# Patient Record
Sex: Female | Born: 1975 | Race: White | Hispanic: No | Marital: Married | Smoking: Never smoker
Health system: Southern US, Community
[De-identification: ages and names within clinical notes are randomized; demographics above are authoritative.]

## PROBLEM LIST (undated history)

## (undated) DIAGNOSIS — F32A Depression, unspecified: Secondary | ICD-10-CM

## (undated) DIAGNOSIS — F419 Anxiety disorder, unspecified: Secondary | ICD-10-CM

## (undated) DIAGNOSIS — G8929 Other chronic pain: Secondary | ICD-10-CM

## (undated) HISTORY — DX: Anxiety disorder, unspecified: F41.9

## (undated) HISTORY — PX: BREAST EXCISIONAL BIOPSY: SUR124

## (undated) HISTORY — DX: Other chronic pain: G89.29

## (undated) HISTORY — DX: Depression, unspecified: F32.A

## (undated) HISTORY — PX: WISDOM TOOTH EXTRACTION: SHX21

---

## 2001-10-22 ENCOUNTER — Other Ambulatory Visit: Admission: RE | Admit: 2001-10-22 | Discharge: 2001-10-22 | Payer: Self-pay | Admitting: Obstetrics and Gynecology

## 2002-09-25 ENCOUNTER — Encounter: Admission: RE | Admit: 2002-09-25 | Discharge: 2002-09-25 | Payer: Self-pay | Admitting: Obstetrics and Gynecology

## 2002-11-11 ENCOUNTER — Other Ambulatory Visit: Admission: RE | Admit: 2002-11-11 | Discharge: 2002-11-11 | Payer: Self-pay | Admitting: Obstetrics and Gynecology

## 2003-11-17 ENCOUNTER — Other Ambulatory Visit: Admission: RE | Admit: 2003-11-17 | Discharge: 2003-11-17 | Payer: Self-pay | Admitting: Obstetrics and Gynecology

## 2003-11-26 ENCOUNTER — Encounter: Admission: RE | Admit: 2003-11-26 | Discharge: 2003-11-26 | Payer: Self-pay | Admitting: Obstetrics and Gynecology

## 2004-01-12 ENCOUNTER — Encounter: Admission: RE | Admit: 2004-01-12 | Discharge: 2004-01-12 | Payer: Self-pay | Admitting: Obstetrics & Gynecology

## 2004-01-12 ENCOUNTER — Encounter (INDEPENDENT_AMBULATORY_CARE_PROVIDER_SITE_OTHER): Payer: Self-pay | Admitting: *Deleted

## 2005-07-31 ENCOUNTER — Encounter: Admission: RE | Admit: 2005-07-31 | Discharge: 2005-07-31 | Payer: Self-pay | Admitting: Obstetrics and Gynecology

## 2007-10-19 ENCOUNTER — Inpatient Hospital Stay (HOSPITAL_COMMUNITY): Admission: AD | Admit: 2007-10-19 | Discharge: 2007-10-22 | Payer: Self-pay | Admitting: Obstetrics and Gynecology

## 2008-03-23 ENCOUNTER — Encounter: Admission: RE | Admit: 2008-03-23 | Discharge: 2008-03-23 | Payer: Self-pay | Admitting: Family Medicine

## 2008-03-24 ENCOUNTER — Ambulatory Visit (HOSPITAL_COMMUNITY): Admission: RE | Admit: 2008-03-24 | Discharge: 2008-03-24 | Payer: Self-pay | Admitting: Family Medicine

## 2008-08-31 ENCOUNTER — Inpatient Hospital Stay (HOSPITAL_COMMUNITY): Admission: AD | Admit: 2008-08-31 | Discharge: 2008-08-31 | Payer: Self-pay | Admitting: Obstetrics & Gynecology

## 2008-12-18 ENCOUNTER — Inpatient Hospital Stay (HOSPITAL_COMMUNITY): Admission: AD | Admit: 2008-12-18 | Discharge: 2008-12-20 | Payer: Self-pay | Admitting: Obstetrics and Gynecology

## 2009-03-31 ENCOUNTER — Encounter: Admission: RE | Admit: 2009-03-31 | Discharge: 2009-03-31 | Payer: Self-pay | Admitting: Obstetrics and Gynecology

## 2009-09-17 IMAGING — CT CT HEAD W/O CM
1 series · 15 of 28 positions shown, 19 images · non-contrast
Comparison: None

Addendum Begins

Critical test results telephoned to Dr. Minoguchi at the time of
interpretation on date 03/23/2008 at time 1258 hours. These results
were repeated back to the interpreting radiologist for
verification.
Addendum Ends
CLINICAL DATA: Frequent headaches, recent near syncopal episodes,
some blurred vision in the right eye linear opacity adjacent to the
right sella suspicious for possible aneurysm.  Recommend MRI of the
brain with MRA.  No acute intracranial abnormality.
CT HEAD WITHOUT CONTRAST
TECHNIQUE: Contiguous axial images were obtained from the base of
the skull through the vertex without contrast.

[Series 2: head · axial · 0.43mm/px · z∈[+17,+142]mm · 15 of 28 slices shown, 19 images]
[im 2/28  brain]
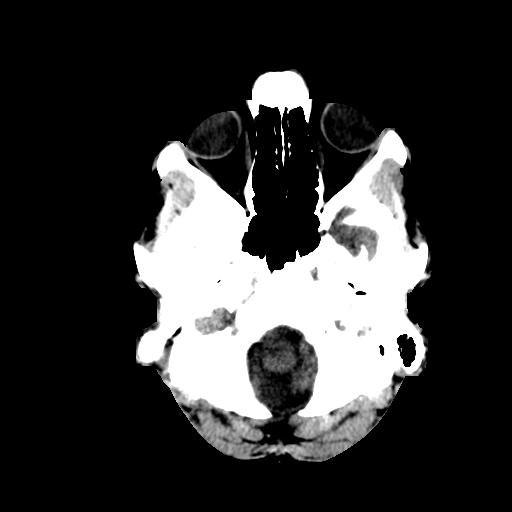
[im 2/28  bone]
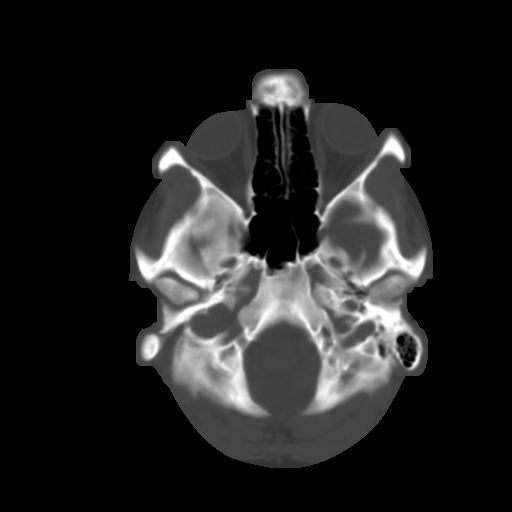
[im 4/28  brain]
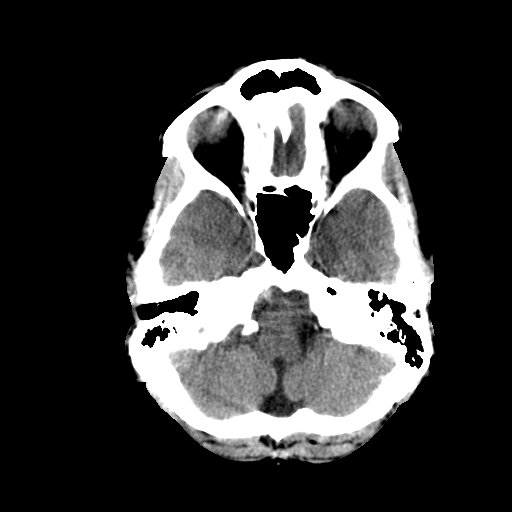
[im 6/28  brain]
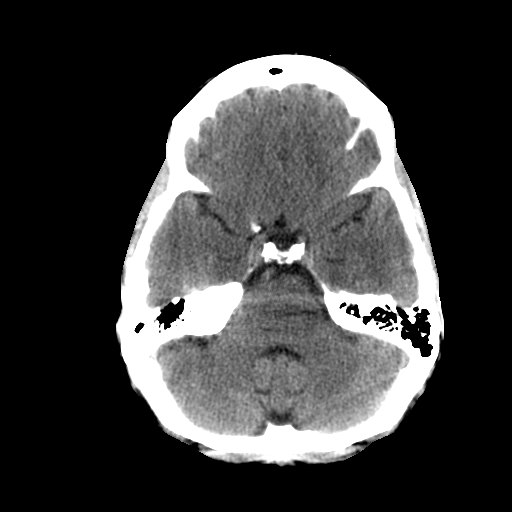
[im 8/28  brain]
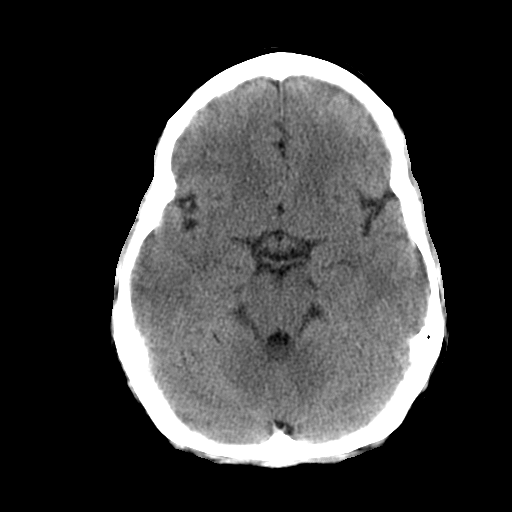
[im 9/28  brain]
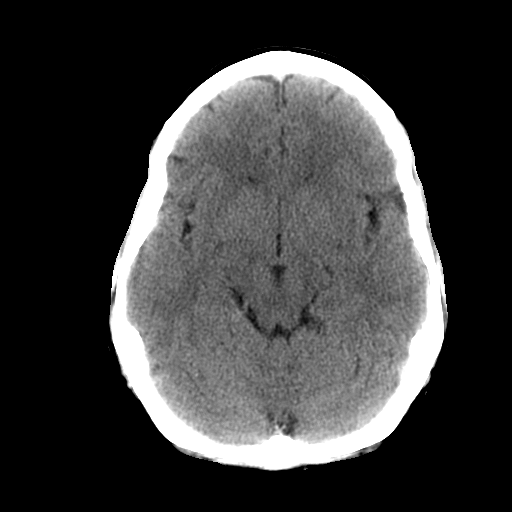
[im 9/28  bone]
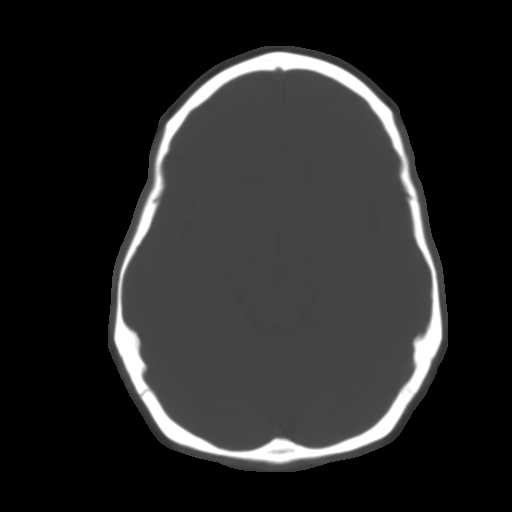
[im 11/28  brain]
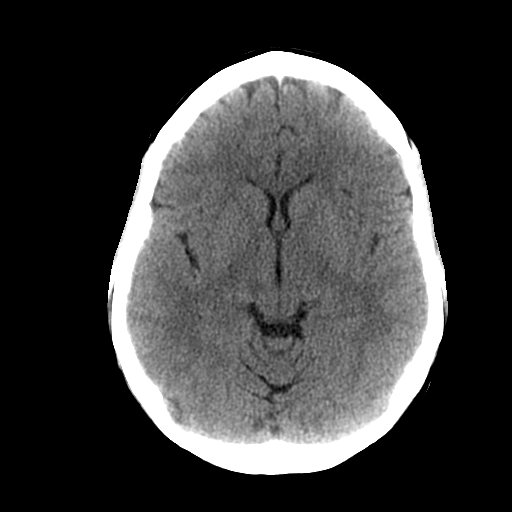
[im 13/28  brain]
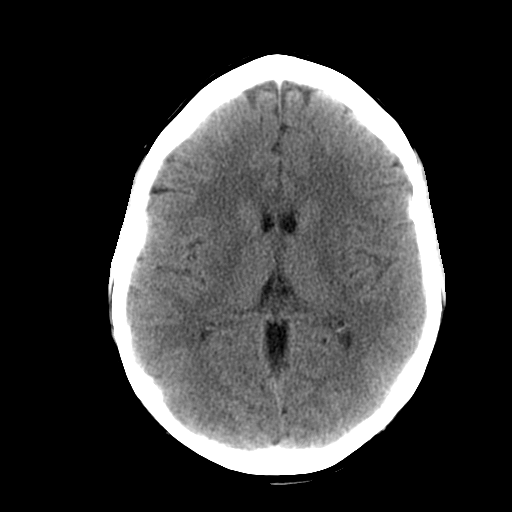
[im 15/28  brain]
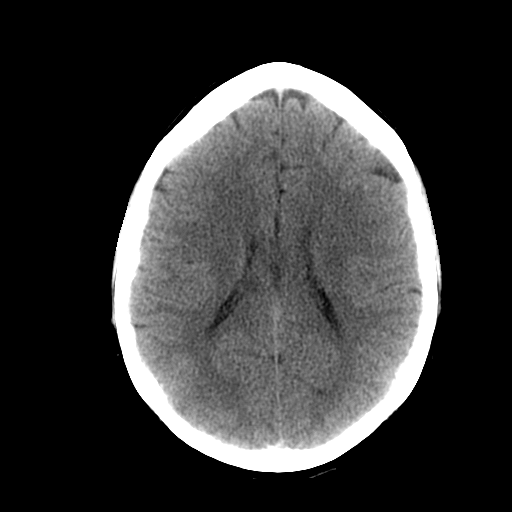
[im 16/28  brain]
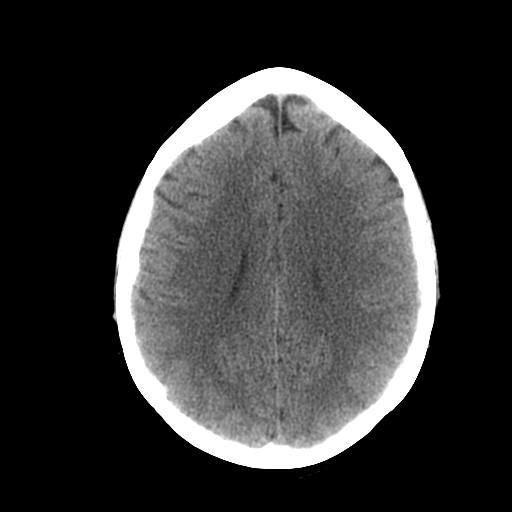
[im 16/28  bone]
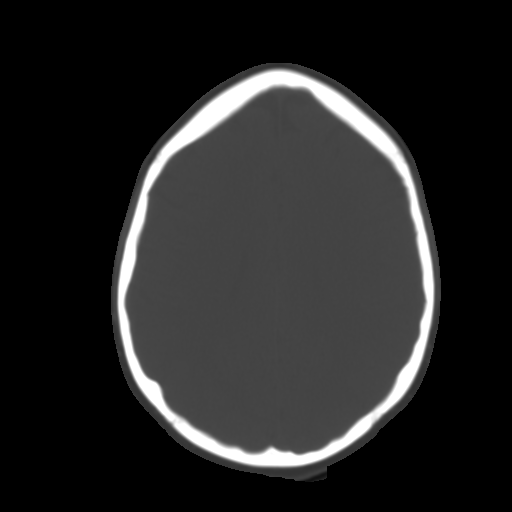
[im 18/28  brain]
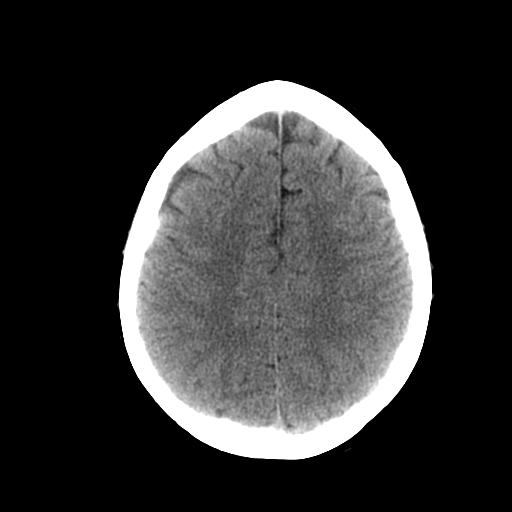
[im 20/28  brain]
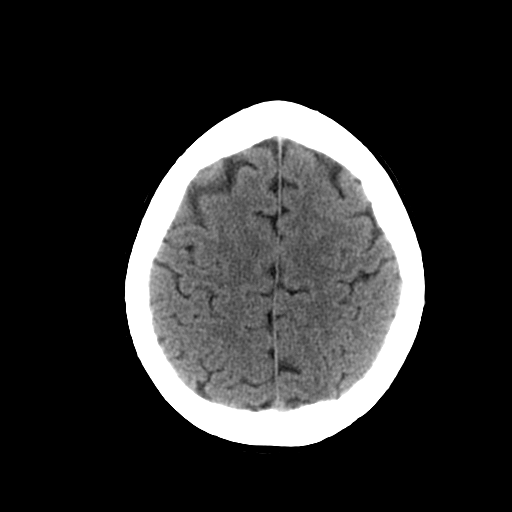
[im 21/28  brain]
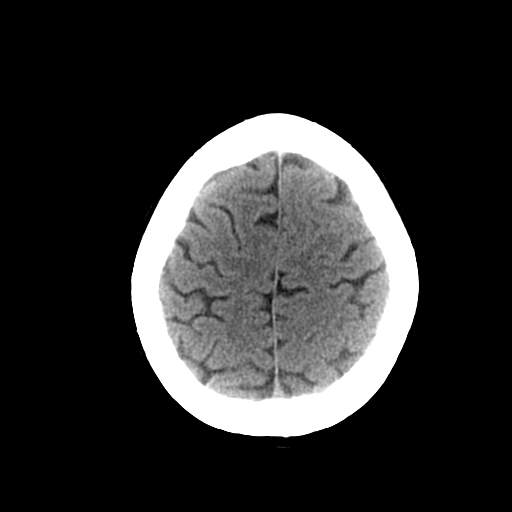
[im 23/28  brain]
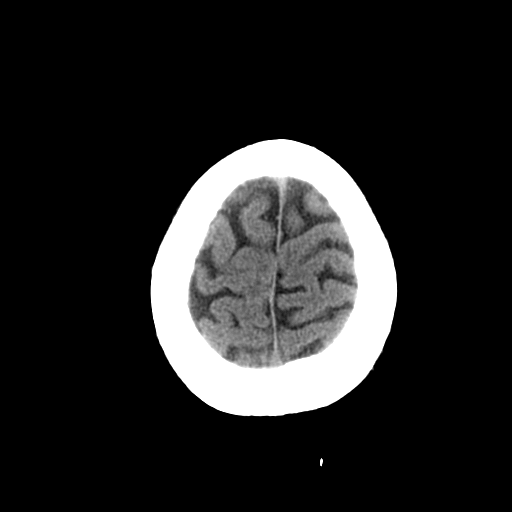
[im 23/28  bone]
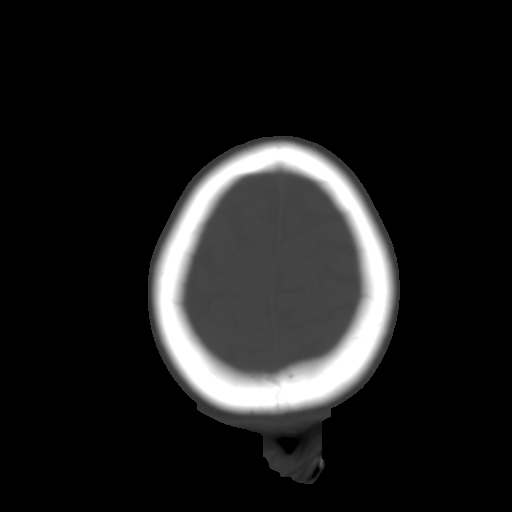
[im 25/28  brain]
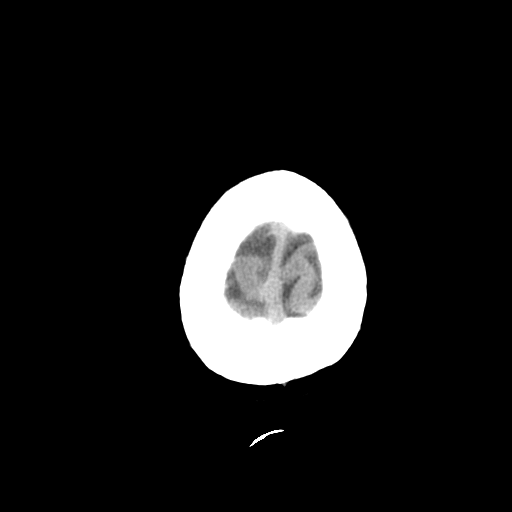
[im 27/28  brain]
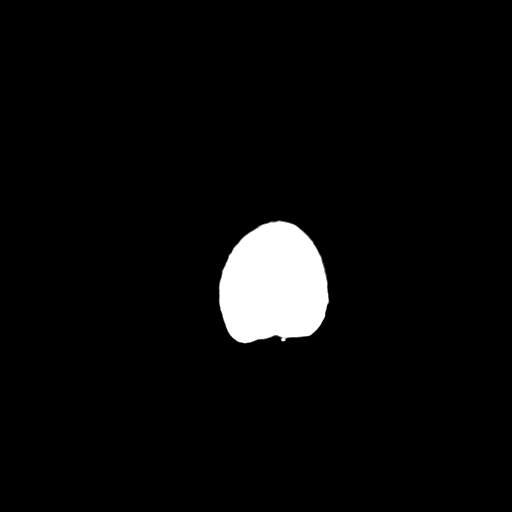

[15 of 28 positions shown; findings below may reference images not displayed]

FINDINGS: The ventricular system is normal in size and
configuration, and the septum is in a normal midline position.  The
fourth ventricle and basilar cisterns appear normal.  No blood,
edema, or mass effect is seen.  There is an area of higher
attenuation right parasellar in location.  This could be vascular
i.e. represent aneurysm involving possibly the right internal
carotid artery or right posterior communicating artery.  MRI of the
brain is recommend with MRA. No bony abnormality is seen..
IMPRESSION: Linear higher attenuation in the right parasellar location
worrisome for aneurysm as described above.  Recommend MRI of the
brain with MRA to assess further.

## 2009-12-30 ENCOUNTER — Encounter: Admission: RE | Admit: 2009-12-30 | Discharge: 2009-12-30 | Payer: Self-pay | Admitting: Internal Medicine

## 2011-01-18 ENCOUNTER — Other Ambulatory Visit: Payer: Self-pay | Admitting: Internal Medicine

## 2011-01-18 DIAGNOSIS — D497 Neoplasm of unspecified behavior of endocrine glands and other parts of nervous system: Secondary | ICD-10-CM

## 2011-01-22 ENCOUNTER — Other Ambulatory Visit: Payer: Self-pay

## 2011-01-26 ENCOUNTER — Ambulatory Visit
Admission: RE | Admit: 2011-01-26 | Discharge: 2011-01-26 | Disposition: A | Discharge status: Home / Self Care | Payer: BC Managed Care – PPO | Source: Ambulatory Visit | Attending: Internal Medicine | Admitting: Internal Medicine

## 2011-01-26 DIAGNOSIS — D497 Neoplasm of unspecified behavior of endocrine glands and other parts of nervous system: Secondary | ICD-10-CM

## 2011-01-26 MED ORDER — GADOBENATE DIMEGLUMINE 529 MG/ML IV SOLN: 10.0000 mL | Freq: Once | INTRAVENOUS | Status: AC | PRN

## 2011-04-02 LAB — CBC
HCT: 33.3 % — ABNORMAL LOW (ref 36.0–46.0)
HCT: 37.2 % (ref 36.0–46.0)
Hemoglobin: 11.3 g/dL — ABNORMAL LOW (ref 12.0–15.0)
Hemoglobin: 12.4 g/dL (ref 12.0–15.0)
MCHC: 33.4 g/dL (ref 30.0–36.0)
MCHC: 33.8 g/dL (ref 30.0–36.0)
MCV: 92.8 fL (ref 78.0–100.0)
MCV: 93.5 fL (ref 78.0–100.0)
Platelets: 156 10*3/uL (ref 150–400)
Platelets: 176 10*3/uL (ref 150–400)
RBC: 3.57 MIL/uL — ABNORMAL LOW (ref 3.87–5.11)
RBC: 4.01 MIL/uL (ref 3.87–5.11)
RDW: 14.2 % (ref 11.5–15.5)
RDW: 14.3 % (ref 11.5–15.5)
WBC: 7.8 10*3/uL (ref 4.0–10.5)
WBC: 9.5 10*3/uL (ref 4.0–10.5)

## 2011-04-02 LAB — RPR: RPR Ser Ql: NONREACTIVE

## 2011-05-01 NOTE — H&P (Signed)
NAME:  Carolyn Rodriguez, Carolyn Rodriguez NO.:  [id number]   MEDICAL RECORD NO.:  [id number]          PATIENT TYPE:  INP   LOCATION:                                FACILITY:  [facility]   PHYSICIAN:  Lenoard Aden, M.D.     DATE OF BIRTH:   DATE OF ADMISSION:  DATE OF DISCHARGE:                              HISTORY & PHYSICAL   CHIEF COMPLAINT:  Postdate oligohydramnios.   A 35 year old white female G1, P0 at 45 weeks' gestation who presents  for cervical ripening and induction, history of AFI of 4 with stable  reassuring surveillance for followup induction as noted.   SHE HAS ALLERGIES TO CODEINE.   MEDICATIONS:  Prenatal vitamins.   She has a family history of breast cancer, leukemia, throat cancer, lung  cancer, heart disease, chronic hypertension, diabetes, and depression.  She has a personal history of a local excision of a benign breast cyst,  history of HSV x1, history of wisdom tooth extraction, history of  lumpectomy as noted for benign polyp.  She was a previous smoker,  stopped with a positive pregnancy test.   PRENATAL COURSE:  Uncomplicated to date.  Of note, group B strep, HIV  was negative and group B strep performed was negative.   PHYSICAL EXAM:  She is a weight of 165 pounds.  Height of 5 feet.  Blood  pressure 100/60.  HEENT:  Normal.  LUNGS:  Clear.  NECK:  Supple.  Full range of motion.  LUNGS:  Clear to auscultation.  HEART:  Regular rhythm.  ABDOMEN:  Soft, gravid, and nontender.  Estimated fetal weight on  ultrasound of 8-1/2 pounds.  Cervix is closed, 70% vertex, 0 station.  EXTREMITIES:  Normal.  NEUROLOGIC:  Nonfocal.  SKIN:  Intact.  ___________ is reactive.  Cervidil was placed.   IMPRESSION:  1. A 41-week intrauterine pregnancy.  2. History of oligohydramnios.  3. Unfavorable cervix.   PLAN:  Proceed with induction.  Risks and benefits discussed.  Epidural  as needed.  Anticipate attempts at vaginal delivery.      Lenoard Aden,  M.D.  Electronically Signed     RJT/MEDQ  D:  10/19/2007  T:  10/20/2007  Job:  [id number]   cc:   Lenoard Aden, M.D.  Fax: [phone]

## 2011-05-01 NOTE — H&P (Signed)
NAME:  CAMARIE, MCTIGUE         ACCOUNT NO.:  [id number]   MEDICAL RECORD NO.:  [id number]          PATIENT TYPE:  INP   LOCATION:  9106                          FACILITY:  [facility]   PHYSICIAN:  Lenoard Aden, M.D.DATE OF BIRTH:  1976-11-24   DATE OF ADMISSION:  12/18/2008  DATE OF DISCHARGE:                              HISTORY & PHYSICAL   CHIEF COMPLAINT:  Postdates induction.   She is a 32-year white female G2, P1, at 51 weeks' gestation, who  presents for induction.   ALLERGIES:  She has no known drug allergies.   MEDICATIONS:  Prenatal vitamins.   SOCIAL HISTORY:  She is a nonsmoker and nondrinker.  She denies domestic  or physical violence.   FAMILY HISTORY:  She has a family history of breast cancer, throat  cancer, lung cancer, heart disease, depression, hypertension, and  diabetes.   PRENATAL COURSE:  Otherwise, uncomplicated first pregnancy, postdates  with an 8 pounds vaginal delivery.   PHYSICAL EXAMINATION:  GENERAL:  She is a well-developed and well-  nourished, white female in no acute distress.  HEENT: Normal.  LUNGS: Clear.  HEART:  Regular rhythm.  ABDOMEN:  Soft, gravid, and nontender.  PELVIC:  Estimated fetal weight 8-10 pounds.  Cervix is 3-cm, 60%,  vertex -1.  EXTREMITIES:  No cords.  NEUROLOGIC:  Nonfocal.  SKIN:  Intact.   IMPRESSION:  1. A 41-week OB.  2. Postdates.   PLAN:  Proceed with an induction.  Risks and benefits are discussed.      Lenoard Aden, M.D.  Electronically Signed    RJT/MEDQ  D:  12/18/2008  T:  12/18/2008  Job:  [id number]

## 2011-09-11 LAB — HCG, SERUM, QUALITATIVE

## 2011-09-25 LAB — CBC
HCT: 32.3 — ABNORMAL LOW
HCT: 34.6 — ABNORMAL LOW
Hemoglobin: 10.8 — ABNORMAL LOW
Hemoglobin: 11.8 — ABNORMAL LOW
MCHC: 33.3
MCHC: 34.1
MCV: 87.4
MCV: 89.4
Platelets: 178
Platelets: 181
RBC: 3.61 — ABNORMAL LOW
RBC: 3.95
RDW: 14.8 — ABNORMAL HIGH
RDW: 15.2 — ABNORMAL HIGH
WBC: 14.3 — ABNORMAL HIGH
WBC: 7.8

## 2011-09-25 LAB — RPR: RPR Ser Ql: NONREACTIVE

## 2011-10-01 ENCOUNTER — Other Ambulatory Visit: Payer: Self-pay | Admitting: Family Medicine

## 2011-10-01 DIAGNOSIS — R51 Headache: Secondary | ICD-10-CM

## 2011-10-03 ENCOUNTER — Ambulatory Visit
Admission: RE | Admit: 2011-10-03 | Discharge: 2011-10-03 | Disposition: A | Discharge status: Home / Self Care | Payer: BC Managed Care – PPO | Source: Ambulatory Visit | Attending: Family Medicine | Admitting: Family Medicine

## 2011-10-03 DIAGNOSIS — R51 Headache: Secondary | ICD-10-CM

## 2012-05-19 ENCOUNTER — Other Ambulatory Visit: Payer: Self-pay | Admitting: Internal Medicine

## 2012-05-19 DIAGNOSIS — E236 Other disorders of pituitary gland: Secondary | ICD-10-CM

## 2012-06-02 ENCOUNTER — Ambulatory Visit
Admission: RE | Admit: 2012-06-02 | Discharge: 2012-06-02 | Disposition: A | Discharge status: Home / Self Care | Payer: BC Managed Care – PPO | Source: Ambulatory Visit | Attending: Internal Medicine | Admitting: Internal Medicine

## 2012-06-02 DIAGNOSIS — E236 Other disorders of pituitary gland: Secondary | ICD-10-CM

## 2012-06-02 MED ORDER — GADOBENATE DIMEGLUMINE 529 MG/ML IV SOLN
6.0000 mL | Freq: Once | INTRAVENOUS | Status: AC | PRN
  Administered 2012-06-02: 6 mL via INTRAVENOUS

## 2012-06-17 ENCOUNTER — Other Ambulatory Visit: Payer: Self-pay | Admitting: Urology

## 2012-07-31 ENCOUNTER — Ambulatory Visit (HOSPITAL_BASED_OUTPATIENT_CLINIC_OR_DEPARTMENT_OTHER): Admission: RE | Admit: 2012-07-31 | Payer: BC Managed Care – PPO | Source: Ambulatory Visit | Admitting: Urology

## 2012-07-31 ENCOUNTER — Encounter (HOSPITAL_BASED_OUTPATIENT_CLINIC_OR_DEPARTMENT_OTHER): Admission: RE | Payer: Self-pay | Source: Ambulatory Visit

## 2012-07-31 SURGERY — CYSTOSCOPY, WITH BLADDER HYDRODISTENSION
Anesthesia: General

## 2016-03-23 ENCOUNTER — Other Ambulatory Visit: Payer: Self-pay | Admitting: Internal Medicine

## 2016-03-23 DIAGNOSIS — E236 Other disorders of pituitary gland: Secondary | ICD-10-CM

## 2016-03-30 ENCOUNTER — Other Ambulatory Visit: Payer: Self-pay

## 2016-03-30 DIAGNOSIS — Z1231 Encounter for screening mammogram for malignant neoplasm of breast: Secondary | ICD-10-CM

## 2016-04-06 ENCOUNTER — Ambulatory Visit
Admission: RE | Admit: 2016-04-06 | Discharge: 2016-04-06 | Disposition: A | Discharge status: Home / Self Care | Payer: BC Managed Care – PPO | Source: Ambulatory Visit | Attending: Internal Medicine | Admitting: Internal Medicine

## 2016-04-06 DIAGNOSIS — E236 Other disorders of pituitary gland: Secondary | ICD-10-CM

## 2016-04-06 MED ORDER — GADOBENATE DIMEGLUMINE 529 MG/ML IV SOLN
5.0000 mL | Freq: Once | INTRAVENOUS | Status: AC | PRN
Start: 2016-04-06 — End: 2016-04-06
  Administered 2016-04-06: 5 mL via INTRAVENOUS

## 2016-04-23 ENCOUNTER — Ambulatory Visit
Admission: RE | Admit: 2016-04-23 | Discharge: 2016-04-23 | Disposition: A | Discharge status: Home / Self Care | Payer: BC Managed Care – PPO | Source: Ambulatory Visit

## 2016-04-23 DIAGNOSIS — Z1231 Encounter for screening mammogram for malignant neoplasm of breast: Secondary | ICD-10-CM

## 2017-04-02 ENCOUNTER — Other Ambulatory Visit: Payer: Self-pay | Admitting: Obstetrics and Gynecology

## 2017-04-02 DIAGNOSIS — Z1231 Encounter for screening mammogram for malignant neoplasm of breast: Secondary | ICD-10-CM

## 2017-04-24 ENCOUNTER — Ambulatory Visit
Admission: RE | Admit: 2017-04-24 | Discharge: 2017-04-24 | Disposition: A | Discharge status: Home / Self Care | Payer: BC Managed Care – PPO | Source: Ambulatory Visit | Attending: Obstetrics and Gynecology | Admitting: Obstetrics and Gynecology

## 2017-04-24 DIAGNOSIS — Z1231 Encounter for screening mammogram for malignant neoplasm of breast: Secondary | ICD-10-CM

## 2018-06-03 ENCOUNTER — Other Ambulatory Visit: Payer: Self-pay | Admitting: Obstetrics and Gynecology

## 2018-06-03 DIAGNOSIS — Z1231 Encounter for screening mammogram for malignant neoplasm of breast: Secondary | ICD-10-CM

## 2018-06-05 ENCOUNTER — Encounter: Payer: Self-pay | Admitting: Radiology

## 2018-06-05 ENCOUNTER — Ambulatory Visit
Admission: RE | Admit: 2018-06-05 | Discharge: 2018-06-05 | Disposition: A | Discharge status: Home / Self Care | Payer: BC Managed Care – PPO | Source: Ambulatory Visit | Attending: Obstetrics and Gynecology | Admitting: Obstetrics and Gynecology

## 2018-06-05 DIAGNOSIS — Z1231 Encounter for screening mammogram for malignant neoplasm of breast: Secondary | ICD-10-CM

## 2019-05-12 ENCOUNTER — Other Ambulatory Visit: Payer: Self-pay | Admitting: Obstetrics and Gynecology

## 2019-05-12 DIAGNOSIS — Z1231 Encounter for screening mammogram for malignant neoplasm of breast: Secondary | ICD-10-CM

## 2019-06-30 ENCOUNTER — Ambulatory Visit
Admission: RE | Admit: 2019-06-30 | Discharge: 2019-06-30 | Disposition: A | Discharge status: Home / Self Care | Payer: BC Managed Care – PPO | Source: Ambulatory Visit | Attending: Obstetrics and Gynecology | Admitting: Obstetrics and Gynecology

## 2019-06-30 DIAGNOSIS — Z1231 Encounter for screening mammogram for malignant neoplasm of breast: Secondary | ICD-10-CM

## 2020-05-27 ENCOUNTER — Other Ambulatory Visit: Payer: Self-pay | Admitting: Obstetrics and Gynecology

## 2020-05-27 DIAGNOSIS — Z1231 Encounter for screening mammogram for malignant neoplasm of breast: Secondary | ICD-10-CM

## 2020-06-30 ENCOUNTER — Ambulatory Visit: Payer: BC Managed Care – PPO

## 2020-06-30 ENCOUNTER — Encounter (INDEPENDENT_AMBULATORY_CARE_PROVIDER_SITE_OTHER): Payer: Self-pay

## 2020-06-30 ENCOUNTER — Ambulatory Visit (INDEPENDENT_AMBULATORY_CARE_PROVIDER_SITE_OTHER): Payer: BC Managed Care – PPO

## 2020-06-30 ENCOUNTER — Other Ambulatory Visit: Payer: Self-pay

## 2020-06-30 DIAGNOSIS — Z1231 Encounter for screening mammogram for malignant neoplasm of breast: Secondary | ICD-10-CM | POA: Diagnosis not present

## 2020-08-09 ENCOUNTER — Encounter: Payer: Self-pay | Admitting: Family Medicine

## 2020-08-09 ENCOUNTER — Ambulatory Visit (INDEPENDENT_AMBULATORY_CARE_PROVIDER_SITE_OTHER): Payer: BC Managed Care – PPO | Admitting: Family Medicine

## 2020-08-09 DIAGNOSIS — R519 Headache, unspecified: Secondary | ICD-10-CM | POA: Insufficient documentation

## 2020-08-09 DIAGNOSIS — G8929 Other chronic pain: Secondary | ICD-10-CM

## 2020-08-09 NOTE — Progress Notes (Signed)
Carolyn Rodriguez - 44 y.o. female MRN [id number]  Date of birth: August 02, 1976  Subjective Chief Complaint  Patient presents with  . Establish Care  . Migraine    HPI Carolyn Rodriguez is a 44 y.o. female here today for initial visit.  She has history of chronic headaches and anxiety.  She has been on bupropion for anxiety which is working pretty well for her. She would like to come off of this eventually.   She reports having headaches several days per week.  Headaches typically occur in  the L frontal area.  She does have some light sensitivity associated with headaches.  She denies nausea or vomiting.  She has not noted any particular triggers. 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  She has never been prescribed medication to help with her headaches.  She has had some relief with excedrin or ibuprofen.    ROS:  A comprehensive ROS was completed and negative except as noted per HPI  Allergies  Allergen Reactions  . Codeine Nausea And Vomiting    Past Medical History:  Diagnosis Date  . Chronic headaches     Past Surgical History:  Procedure Laterality Date  . BREAST EXCISIONAL BIOPSY Left     Social History   Socioeconomic History  . Marital status: Married    Spouse name: Not on file  . Number of children: 2  . Years of education: Not on file  . Highest education level: Not on file  Occupational History  . Occupation: Pharmacist, hospital  Tobacco Use  . Smoking status: Never Smoker  . Smokeless tobacco: Never Used  Vaping Use  . Vaping Use: Never used  Substance and Sexual Activity  . Alcohol use: Never  . Drug use: Never  . Sexual activity: Yes    Partners: Male    Birth control/protection: I.U.D.  Other Topics Concern  . Not on file  Social History Narrative  . Not on file   Social Determinants of Health   Financial Resource Strain:   . Difficulty of Paying Living Expenses: Not on file  Food Insecurity:   .  Worried About Charity fundraiser in the Last Year: Not on file  . Ran Out of Food in the Last Year: Not on file  Transportation Needs:   . Lack of Transportation (Medical): Not on file  . Lack of Transportation (Non-Medical): Not on file  Physical Activity:   . Days of Exercise per Week: Not on file  . Minutes of Exercise per Session: Not on file  Stress:   . Feeling of Stress : Not on file  Social Connections:   . Frequency of Communication with Friends and Family: Not on file  . Frequency of Social Gatherings with Friends and Family: Not on file  . Attends Religious Services: Not on file  . Active Member of Clubs or Organizations: Not on file  . Attends Archivist Meetings: Not on file  . Marital Status: Not on file    Family History  Problem Relation Age of Onset  . Breast cancer Maternal Aunt 34  . Lung cancer Maternal Aunt   . Lung cancer Maternal Grandmother   . Leukemia Paternal Grandfather     Health Maintenance  Topic Date Due  . Hepatitis C Screening  Never done  . HIV Screening  Never done  . TETANUS/TDAP  Never done  . PAP SMEAR-Modifier  Never done  . INFLUENZA VACCINE  07/17/2020  .  COVID-19 Vaccine (2 - Pfizer 2-dose series) 08/10/2020     ----------------------------------------------------------------------------------------------------------------------------------------------------------------------------------------------------------------- Physical Exam BP 118/80 (BP Location: Left Arm, Patient Position: Sitting, Cuff Size: Small)   Pulse 81   Temp 98.9 F (37.2 C) (Oral)   Ht 4' 11.65" (1.515 m)   Wt 145 lb 6.4 oz (66 kg)   SpO2 98%   BMI 28.73 kg/m   Physical Exam Constitutional:      Appearance: Normal appearance.  Skin:    General: Skin is warm and dry.  Neurological:     General: No focal deficit present.     Mental Status: She is alert and oriented to person, place, and time.  Psychiatric:        Mood and Affect: Mood  normal.        Behavior: Behavior normal.     ------------------------------------------------------------------------------------------------------------------------------------------------------------------------------------------------------------------- Assessment and Plan  Chronic headache She does have some symptoms that are suggestive of migraines.  Discussed limiting analgesics She would prefer a more holistic approach to managing her headaches.  Recommend staying well hydrated and be sure to get plenty of sleep.  She will try magnesium supplement.  She will also consider trying acupuncture  Discussed medication options as well if alternative remedies are not helpful.  She will follow up in 6-8 weeks.     No orders of the defined types were placed in this encounter.   No follow-ups on file.  45 minutes spent including pre visit preparation, review of prior notes and labs, encounter with patient and same day documentation.  .  This visit occurred during the SARS-CoV-2 public health emergency.  Safety protocols were in place, including screening questions prior to the visit, additional usage of staff PPE, and extensive cleaning of exam room while observing appropriate contact time as indicated for disinfecting solutions.

## 2020-08-09 NOTE — Patient Instructions (Signed)
[facility] [address]. [place], [place] [address] [phone]  Try Magnesium:  400-600mg  daily may be helpful. It may take a few weeks for you to notice a difference with this.  Also stay well hydrated and be sure to get plenty of sleep.   If this isn't helpful I would recommend adding: topiramate (topamax) with imitrex or zomig to use as needed for acute headaches.   Additional options include nurtec or injectable migraine prevention medications.

## 2020-08-09 NOTE — Assessment & Plan Note (Signed)
She does have some symptoms that are suggestive of migraines.  Discussed limiting analgesics She would prefer a more holistic approach to managing her headaches.  Recommend staying well hydrated and be sure to get plenty of sleep.  She will try magnesium supplement.  She will also consider trying acupuncture  Discussed medication options as well if alternative remedies are not helpful.  She will follow up in 6-8 weeks.

## 2020-09-20 ENCOUNTER — Ambulatory Visit: Payer: BC Managed Care – PPO | Admitting: Family Medicine

## 2020-12-19 ENCOUNTER — Telehealth: Payer: Self-pay | Admitting: Family Medicine

## 2020-12-19 NOTE — Telephone Encounter (Signed)
This is fine with me   

## 2020-12-19 NOTE — Telephone Encounter (Signed)
Patient would like to switch to [facility]. Nothing against Dr. Ashley Royalty, her kids were set up with [facility] and would like the whole family to see the same provider.

## 2020-12-20 NOTE — Telephone Encounter (Signed)
Ok with me 

## 2021-01-12 ENCOUNTER — Encounter: Payer: Self-pay | Admitting: Physician Assistant

## 2021-01-12 ENCOUNTER — Telehealth: Payer: BC Managed Care – PPO | Admitting: Physician Assistant

## 2021-01-12 DIAGNOSIS — U071 COVID-19: Secondary | ICD-10-CM

## 2021-01-12 MED ORDER — PULMICORT FLEXHALER 180 MCG/ACT IN AEPB: 1.0000 | INHALATION_SPRAY | Freq: Two times a day (BID) | RESPIRATORY_TRACT | 0 refills | Status: DC

## 2021-01-12 NOTE — Progress Notes (Addendum)
MyChart Video Visit    Virtual Visit via Video Note   This visit type was conducted due to national recommendations for restrictions regarding the COVID-19 Pandemic (e.g. social distancing) in an effort to limit this patient's exposure and mitigate transmission in our community. This patient is at least at moderate risk for complications without adequate follow up. This format is felt to be most appropriate for this patient at this time. Physical exam was limited by quality of the video and audio technology used for the visit.   Patient location: Home Provider location: Home office in [place], [place]  I discussed the limitations of evaluation and management by telemedicine and the availability of in person appointments. The patient expressed understanding and agreed to proceed.  Patient: Carolyn Rodriguez   DOB: [birth year]   45 y.o. Female  MRN: [id number] Visit Date: 01/12/2021  Today's healthcare provider: Mar Daring, PA-C   No chief complaint on file.  Subjective    HPI  Carolyn Rodriguez is a 45 yr old female that presents today via video visit for symptoms associated with Covid-19. She reports that last Wednesday she started with a fever and body aches. By Thursday was feeling some better. Friday had mild cough. Throughout has had continued fatigue and tiredness. Has been sleeping more. Monday was tested and was positive for Covid-19. She is still having fatigue. Now also complains of shortness of breath. Shortness of breath is only with activity. At rest she has no issues and is able to talk in full sentences without stopping to catch breath. She is having no other symptoms of covid 19 at this time. She is unable to monitor her oxygen levels at this time.   Patient Active Problem List   Diagnosis Date Noted  . Chronic headache 08/09/2020   Past Medical History:  Diagnosis Date  . Chronic headaches       Medications: Outpatient Medications Prior to Visit   Medication Sig  . buPROPion (WELLBUTRIN XL) 300 MG 24 hr tablet Take 300 mg by mouth daily.  . Calcium Carbonate-Vit D-Min (CALCIUM 1200 PO) Take 1,200 mg by mouth daily.  Marland Kitchen levonorgestrel (MIRENA) 20 MCG/24HR IUD by Intrauterine route.  . Multiple Vitamins-Minerals (WOMENS DAILY FORMULA PO) Take by mouth.   No facility-administered medications prior to visit.    Review of Systems  Constitutional: Positive for fatigue. Negative for fever.  HENT: Negative.   Respiratory: Positive for chest tightness and shortness of breath. Negative for cough and wheezing.   Cardiovascular: Negative.   Gastrointestinal: Negative.   Neurological: Positive for headaches (over left eyebrow;controlled with excedrin). Negative for dizziness.    Last CBC Lab Results  Component Value Date   WBC 9.5 12/19/2008   HGB 11.3 (L) 12/19/2008   HCT 33.3 (L) 12/19/2008   MCV 93.5 12/19/2008   RDW 14.3 12/19/2008   PLT 156 28/76/8115   Last metabolic panel No results found for: GLUCOSE, NA, K, CL, CO2, BUN, CREATININE, GFRNONAA, GFRAA, CALCIUM, PHOS, PROT, ALBUMIN, LABGLOB, AGRATIO, BILITOT, ALKPHOS, AST, ALT, ANIONGAP    Objective    There were no vitals taken for this visit. BP Readings from Last 3 Encounters:  08/09/20 118/80   Wt Readings from Last 3 Encounters:  08/09/20 145 lb 6.4 oz (66 kg)      Physical Exam Vitals reviewed.  Constitutional:      General: She is not in acute distress.    Appearance: Normal appearance. She is well-developed and well-nourished. She is not ill-appearing.  HENT:     Head: Normocephalic and atraumatic.  Eyes:     Extraocular Movements: EOM normal.  Pulmonary:     Effort: Pulmonary effort is normal. No respiratory distress (patient able to speak in full sentences without issue).  Musculoskeletal:     Cervical back: Normal range of motion and neck supple.  Neurological:     Mental Status: She is alert.  Psychiatric:        Mood and Affect: Mood and affect  normal.        Behavior: Behavior normal.        Thought Content: Thought content normal.        Judgment: Judgment normal.       Assessment & Plan     1. COVID-19 Patient tested positive for Covid 19 on Monday, 01/09/21. Having some mild DOE. No SOB other than with activity. No concerning features at this time. Will give Pulmicort inhaler as below for inflammation, possible bronchitis with Covid-19. Advised patient to get a pulse oximeter to monitor oxygen levels. If oxygen is below 88% she is to go to the ER. Discussed if she develops SOB at rest, chest pain/pressure, or racing heart beat she is to call 9-1-1 and go to ER. If symptoms persist she is to follow up with her PCP and be seen in person with either PCP or have appointment arranged with the respiratory clinic for patients with Covid 19. She voiced understanding.  - budesonide (PULMICORT FLEXHALER) 180 MCG/ACT inhaler; Inhale 1 puff into the lungs in the morning and at bedtime.  Dispense: 1 each; Refill: 0   No follow-ups on file.     I discussed the assessment and treatment plan with the patient. The patient was provided an opportunity to ask questions and all were answered. The patient agreed with the plan and demonstrated an understanding of the instructions.   The patient was advised to call back or seek an in-person evaluation if the symptoms worsen or if the condition fails to improve as anticipated.  I provided 18 minutes of non-face-to-face time during this encounter.  Reynolds Bowl, PA-C, have reviewed all documentation for this visit. The documentation on 01/12/21 for the exam, diagnosis, procedures, and orders are all accurate and complete.  [place] Marlin [phone] (phone) [phone] (fax)  [place]

## 2021-01-12 NOTE — Patient Instructions (Signed)

## 2021-01-17 ENCOUNTER — Encounter: Payer: BC Managed Care – PPO | Admitting: Medical-Surgical

## 2021-01-31 ENCOUNTER — Ambulatory Visit (INDEPENDENT_AMBULATORY_CARE_PROVIDER_SITE_OTHER): Payer: BC Managed Care – PPO | Admitting: Medical-Surgical

## 2021-01-31 ENCOUNTER — Other Ambulatory Visit: Payer: Self-pay

## 2021-01-31 ENCOUNTER — Encounter: Payer: Self-pay | Admitting: Medical-Surgical

## 2021-01-31 VITALS — BP 97/65 | HR 74 | Temp 98.4°F | Ht 60.0 in | Wt 152.7 lb

## 2021-01-31 DIAGNOSIS — R519 Headache, unspecified: Secondary | ICD-10-CM

## 2021-01-31 DIAGNOSIS — G8929 Other chronic pain: Secondary | ICD-10-CM

## 2021-01-31 DIAGNOSIS — R635 Abnormal weight gain: Secondary | ICD-10-CM

## 2021-01-31 DIAGNOSIS — Z0000 Encounter for general adult medical examination without abnormal findings: Secondary | ICD-10-CM

## 2021-01-31 MED ORDER — TOPIRAMATE 25 MG PO TABS: ORAL_TABLET | ORAL | 1 refills | Status: DC

## 2021-01-31 MED ORDER — PHENTERMINE HCL 37.5 MG PO TABS: ORAL_TABLET | ORAL | 0 refills | Status: DC

## 2021-01-31 NOTE — Patient Instructions (Addendum)
Preventive Care 84-45 Years Old, Female Preventive care refers to lifestyle choices and visits with your health care provider that can promote health and wellness. This includes:  A yearly physical exam. This is also called an annual wellness visit.  Regular dental and eye exams.  Immunizations.  Screening for certain conditions.  Healthy lifestyle choices, such as: ? Eating a healthy diet. ? Getting regular exercise. ? Not using drugs or products that contain nicotine and tobacco. ? Limiting alcohol use. What can I expect for my preventive care visit? Physical exam Your health care provider will check your:  Height and weight. These may be used to calculate your BMI (body mass index). BMI is a measurement that tells if you are at a healthy weight.  Heart rate and blood pressure.  Body temperature.  Skin for abnormal spots. Counseling Your health care provider may ask you questions about your:  Past medical problems.  Family's medical history.  Alcohol, tobacco, and drug use.  Emotional well-being.  Home life and relationship well-being.  Sexual activity.  Diet, exercise, and sleep habits.  Work and work Statistician.  Access to firearms.  Method of birth control.  Menstrual cycle.  Pregnancy history. What immunizations do I need? Vaccines are usually given at various ages, according to a schedule. Your health care provider will recommend vaccines for you based on your age, medical history, and lifestyle or other factors, such as travel or where you work.   What tests do I need? Blood tests  Lipid and cholesterol levels. These may be checked every 5 years, or more often if you are over 3 years old.  Hepatitis C test.  Hepatitis B test. Screening  Lung cancer screening. You may have this screening every year starting at age 45 if you have a 30-pack-year history of smoking and currently smoke or have quit within the past 15 years.  Colorectal cancer  screening. ? All adults should have this screening starting at age 45 and continuing until age 17. ? Your health care provider may recommend screening at age 45 if you are at increased risk. ? You will have tests every 1-10 years, depending on your results and the type of screening test.  Diabetes screening. ? This is done by checking your blood sugar (glucose) after you have not eaten for a while (fasting). ? You may have this done every 1-3 years.  Mammogram. ? This may be done every 1-2 years. ? Talk with your health care provider about when you should start having regular mammograms. This may depend on whether you have a family history of breast cancer.  BRCA-related cancer screening. This may be done if you have a family history of breast, ovarian, tubal, or peritoneal cancers.  Pelvic exam and Pap test. ? This may be done every 3 years starting at age 45. ? Starting at age 45, this may be done every 5 years if you have a Pap test in combination with an HPV test. Other tests  STD (sexually transmitted disease) testing, if you are at risk.  Bone density scan. This is done to screen for osteoporosis. You may have this scan if you are at high risk for osteoporosis. Talk with your health care provider about your test results, treatment options, and if necessary, the need for more tests. Follow these instructions at home: Eating and drinking  Eat a diet that includes fresh fruits and vegetables, whole grains, lean protein, and low-fat dairy products.  Take vitamin and mineral supplements  as recommended by your health care provider.  Do not drink alcohol if: ? Your health care provider tells you not to drink. ? You are pregnant, may be pregnant, or are planning to become pregnant.  If you drink alcohol: ? Limit how much you have to 0-1 drink a day. ? Be aware of how much alcohol is in your drink. In the [place] one drink equals one 12 oz bottle of beer (355 mL), one 5 oz glass of  wine (148 mL), or one 1 oz glass of hard liquor (44 mL).   Lifestyle  Take daily care of your teeth and gums. Brush your teeth every morning and night with fluoride toothpaste. Floss one time each day.  Stay active. Exercise for at least 30 minutes 5 or more days each week.  Do not use any products that contain nicotine or tobacco, such as cigarettes, e-cigarettes, and chewing tobacco. If you need help quitting, ask your health care provider.  Do not use drugs.  If you are sexually active, practice safe sex. Use a condom or other form of protection to prevent STIs (sexually transmitted infections).  If you do not wish to become pregnant, use a form of birth control. If you plan to become pregnant, see your health care provider for a prepregnancy visit.  If told by your health care provider, take low-dose aspirin daily starting at age 45.  Find healthy ways to cope with stress, such as: ? Meditation, yoga, or listening to music. ? Journaling. ? Talking to a trusted person. ? Spending time with friends and family. Safety  Always wear your seat belt while driving or riding in a vehicle.  Do not drive: ? If you have been drinking alcohol. Do not ride with someone who has been drinking. ? When you are tired or distracted. ? While texting.  Wear a helmet and other protective equipment during sports activities.  If you have firearms in your house, make sure you follow all gun safety procedures. What's next?  Visit your health care provider once a year for an annual wellness visit.  Ask your health care provider how often you should have your eyes and teeth checked.  Stay up to date on all vaccines. This information is not intended to replace advice given to you by your health care provider. Make sure you discuss any questions you have with your health care provider. Document Revised: 09/06/2020 Document Reviewed: 08/14/2018 Elsevier Patient Education  2021 [employer].    Weight loss tips and tricks:  1.  Make sure to drink at least 64 ounces of water every day. 2.  Avoid alcohol. 3.  Avoid eating within 3 hours of going to bed. 4.  Cut out sugary drinks such as sweet tea, regular sodas, energy drinks, etc. 5.  Make sure you are getting enough sleep every night. 6.  Start making changes by cutting back portion sizes by 1/3. 7.  Keep a food diary to help identify areas for improvement and promote awareness of bad habits. 8.  Increase your activity.  Choose something you like to do that is fun for you so you are more likely to stick with it. 9.  Do not forget your protein! 10.  Measure your neck, upper arms, waist, hips, and thighs and write those measurements down somewhere before you start your weight loss journey.  Changes in your measurements will tell a far more accurate story than the number on a scale. 11.  As you go,  pay attention to how your clothes fit! 12.  Weigh yourself as often or as little as you need to.  Some folks do better weighing every day while others do better with once a week. 13.  Do not get discouraged!  Weight loss efforts are meant to be lifestyle changes.  Once you stop a medication (if you are taking 1), your behaviors and habits will determine if you maintain your weight loss or not.  For those on medications:  1.  Take your medication as prescribed every day first thing in the morning. 2.  Common side effects include nausea and constipation.  To combat this, increased your daily water consumption.  Consider adding in a stool softener (available OTC) if needed.  Also increase fiber intake with vegetables and fruits. 3.  If you have any side effects or concerns while taking medication, please do not hesitate to reach out to [place] here at the office. 4.  While on weight loss medications, we do require you to follow-up every 4 weeks with an in office visit.  Refills will not be called in early on controlled substances.  Good  luck on your weight loss journey!  Have faith in your self and you will reach your goals!  Tension Headache, Adult A tension headache is a feeling of pain, pressure, or aching over the front and sides of the head. The pain can be dull, or it can feel tight. There are two types of tension headache:  Episodic tension headache. This is when the headaches happen fewer than 15 days a month.  Chronic tension headache. This is when the headaches happen more than 15 days a month during a 83-monthperiod. A tension headache can last from 30 minutes to several days. It is the most common kind of headache. Tension headaches are not normally associated with nausea or vomiting, and they do not get worse with physical activity. What are the causes? The exact cause of this condition is not known. Tension headaches are often triggered by stress, anxiety, or depression. Other triggers may include:  Alcohol.  Too much caffeine or caffeine withdrawal.  Respiratory infections, such as colds, flu, or sinus infections.  Dental problems or teeth clenching.  Fatigue.  Holding your head and neck in the same position for a long period of time, such as while using a computer.  Smoking.  Arthritis of the neck. What are the signs or symptoms? Symptoms of this condition include:  A feeling of pressure or tightness around the head.  Dull, aching head pain.  Pain over the front and sides of the head.  Tenderness in the muscles of the head, neck, and shoulders. How is this diagnosed? This condition may be diagnosed based on your symptoms, your medical history, and a physical exam. If your symptoms are severe or unusual, you may have imaging tests, such as a CT scan or an MRI of your head. Your vision may also be checked. How is this treated? This condition may be treated with lifestyle changes and with medicines that help relieve symptoms. Follow these instructions at home: Managing pain  Take  over-the-counter and prescription medicines only as told by your health care provider.  When you have a headache, lie down in a dark, quiet room.  If directed, put ice on your head and neck. To do this: ? Put ice in a plastic bag. ? Place a towel between your skin and the bag. ? Leave the ice on for 20 minutes, 2-3  times a day. ? Remove the ice if your skin turns bright red. This is very important. If you cannot feel pain, heat, or cold, you have a greater risk of damage to the area.  If directed, apply heat to the back of your neck as often as told by your health care provider. Use the heat source that your health care provider recommends, such as a moist heat pack or a heating pad. ? Place a towel between your skin and the heat source. ? Leave the heat on for 20-30 minutes. ? Remove the heat if your skin turns bright red. This is especially important if you are unable to feel pain, heat, or cold. You have a greater risk of getting burned. Eating and drinking  Eat meals on a regular schedule.  If you drink alcohol: ? Limit how much you have to:  0-1 drink a day for women who are not pregnant.  0-2 drinks a day for men. ? Know how much alcohol is in your drink. In the [place] one drink equals one 12 oz bottle of beer (355 mL), one 5 oz glass of wine (148 mL), or one 1 oz glass of hard liquor (44 mL).  Drink enough fluid to keep your urine pale yellow.  Decrease your caffeine intake, or stop using caffeine. Lifestyle  Get 7-9 hours of sleep each night, or get the amount of sleep recommended by your health care provider.  At bedtime, remove computers, phones, and tablets from your room.  Find ways to manage your stress. This may include: ? Exercise. ? Deep breathing exercises. ? Yoga. ? Listening to music. ? Positive mental imagery.  Try to sit up straight and avoid tensing your muscles.  Do not use any products that contain nicotine or tobacco. These include cigarettes,  chewing tobacco, and vaping devices, such as e-cigarettes. If you need help quitting, ask your health care provider. General instructions  Avoid any headache triggers. Keep a journal to help find out what may trigger your headaches. For example, write down: ? What you eat and drink. ? How much sleep you get. ? Any change to your diet or medicines.  Keep all follow-up visits. This is important.   Contact a health care provider if:  Your headache does not get better.  Your headache comes back.  You are sensitive to sounds, light, or smells because of a headache.  You have nausea or you vomit.  Your stomach hurts. Get help right away if:  You suddenly develop a severe headache, along with any of the following: ? A stiff neck. ? Nausea and vomiting. ? Confusion. ? Weakness in one part or one side of your body. ? Double vision or loss of vision. ? Shortness of breath. ? Rash. ? Unusual sleepiness. ? Fever or chills. ? Trouble speaking. ? Pain in your eye or ear. ? Trouble walking or balancing. ? Feeling faint or passing out. Summary  A tension headache is a feeling of pain, pressure, or aching over the front and sides of the head.  A tension headache can last from 30 minutes to several days. It is the most common kind of headache.  This condition may be diagnosed based on your symptoms, your medical history, and a physical exam.  This condition may be treated with lifestyle changes and with medicines that help relieve symptoms. This information is not intended to replace advice given to you by your health care provider. Make sure you discuss any questions you  have with your health care provider. Document Revised: 09/01/2020 Document Reviewed: 09/01/2020 Elsevier Patient Education  2021 [employer].

## 2021-01-31 NOTE — Progress Notes (Signed)
HPI: Carolyn Rodriguez is a 45 y.o. female who  has a past medical history of Chronic headaches.  she presents to [facility] today, 01/31/21,  for chief complaint of: Annual physical exam  Dentist: every 6 months Eye exam: yearly, UTD Exercise: restarted a few days ago Diet: no special diets, no red meat Pap smear: up to date Mammogram: up to date COVID vaccine: done, no booster  Concerns: weight gain, headaches  Past medical, surgical, social and family history reviewed:  Patient Active Problem List   Diagnosis Date Noted  . Chronic headache 08/09/2020    Past Surgical History:  Procedure Laterality Date  . BREAST EXCISIONAL BIOPSY Left     Social History   Tobacco Use  . Smoking status: Never Smoker  . Smokeless tobacco: Never Used  Substance Use Topics  . Alcohol use: Never    Family History  Problem Relation Age of Onset  . Breast cancer Maternal Aunt 34  . Lung cancer Maternal Aunt   . Lung cancer Maternal Grandmother   . Leukemia Paternal Grandfather      Current medication list and allergy/intolerance information reviewed:    Current Outpatient Medications  Medication Sig Dispense Refill  . budesonide (PULMICORT FLEXHALER) 180 MCG/ACT inhaler Inhale 1 puff into the lungs in the morning and at bedtime. 1 each 0  . buPROPion (WELLBUTRIN XL) 300 MG 24 hr tablet Take 300 mg by mouth daily.    . Calcium Carbonate-Vit D-Min (CALCIUM 1200 PO) Take 1,200 mg by mouth daily.    Marland Kitchen levonorgestrel (MIRENA) 20 MCG/24HR IUD by Intrauterine route.    . Multiple Vitamins-Minerals (WOMENS DAILY FORMULA PO) Take by mouth.    . phentermine (ADIPEX-P) 37.5 MG tablet One-half tab by mouth qAM 30 tablet 0  . topiramate (TOPAMAX) 25 MG tablet Take 1 tablet (43m) daily for 2 weeks then increase to 2 tablets (517m daily. If doing well on 2560maily, ok to stay there. 46 tablet 1   No current facility-administered medications for this  visit.    Allergies  Allergen Reactions  . Codeine Nausea And Vomiting   Review of Systems:  Constitutional:  No  fever, no chills, + recent illness (COVID) , + unintentional weight changes. + significant fatigue.   HEENT: + headache, no vision change, no hearing change, No sore throat, No  sinus pressure  Cardiac: No  chest pain, No  pressure, No palpitations, No  Orthopnea  Respiratory:  No  shortness of breath. No  Cough  Gastrointestinal: No  abdominal pain, No  nausea, No  vomiting,  No  blood in stool, No  diarrhea, No  constipation   Musculoskeletal: No new myalgia/arthralgia  Skin: No  Rash, No other wounds/concerning lesions  Genitourinary: No  incontinence, No  abnormal genital bleeding, No abnormal genital discharge  Hem/Onc: No  easy bruising/bleeding, No  abnormal lymph node  Endocrine: No cold intolerance,  No heat intolerance. No polyuria/polydipsia/polyphagia   Neurologic: No  weakness, No  dizziness, No  slurred speech/focal weakness/facial droop  Psychiatric: No  concerns with depression, No  concerns with anxiety, No sleep problems, No mood problems  Exam:  BP 97/65   Pulse 74   Temp 98.4 F (36.9 C)   Ht 5' (1.524 m)   Wt 152 lb 11.2 oz (69.3 kg)   SpO2 98%   BMI 29.82 kg/m   Constitutional: VS see above. General Appearance: alert, well-developed, well-nourished, NAD  Eyes: Normal lids and conjunctive,  non-icteric sclera  Ears, Nose, Mouth, Throat: MMM, Normal external inspection ears/nares/mouth/lips/gums. TM normal bilaterally.    Neck: No masses, trachea midline. No thyroid enlargement. No tenderness/mass appreciated. No lymphadenopathy  Respiratory: Normal respiratory effort. no wheeze, no rhonchi, no rales  Cardiovascular: S1/S2 normal, no murmur, no rub/gallop auscultated. RRR. No lower extremity edema. Pedal pulse II/IV bilaterally PT. No carotid bruit or JVD. No abdominal aortic bruit.  Gastrointestinal: Nontender, no masses. No  hepatomegaly, no splenomegaly. No hernia appreciated. Bowel sounds normal. Rectal exam deferred.   Musculoskeletal: Gait normal. No clubbing/cyanosis of digits.   Neurological: Normal balance/coordination. No tremor. No cranial nerve deficit on limited exam. Motor and sensation intact and symmetric. Cerebellar reflexes intact.   Skin: warm, dry, intact. No rash/ulcer. No concerning nevi or subq nodules on limited exam.    Psychiatric: Normal judgment/insight. Normal mood and affect. Oriented x3.    No results found for this or any previous visit (from the past 72 hour(s)).  No results found.   ASSESSMENT/PLAN:   1. Annual physical exam Checking CBC with diff, CMP, and lipid panel. Up to date on other preventative care.  - CBC with Differential/Platelet - COMPLETE METABOLIC PANEL WITH GFR - Lipid panel  2. Weight gain Checking TSH. Discussed weight loss options. Would like to try phentermine/topamax. Discussed medication, potential side effects, and monitoring while on weight loss medications.  - TSH  3. Chronic nonintractable headache, unspecified headache type Discussed prophylaxis for frequent headaches. Presentation most consistent with tension type headaches. Starting Topamax to help with weight loss. This will likely help reduce headache frequency as well.   Orders Placed This Encounter  Procedures  . CBC with Differential/Platelet  . COMPLETE METABOLIC PANEL WITH GFR  . Lipid panel  . TSH    Meds ordered this encounter  Medications  . phentermine (ADIPEX-P) 37.5 MG tablet    Sig: One-half tab by mouth qAM    Dispense:  30 tablet    Refill:  0    Order Specific Question:   Supervising Provider    Answer:   Emeterio Reeve [[id number]]  . topiramate (TOPAMAX) 25 MG tablet    Sig: Take 1 tablet (40m) daily for 2 weeks then increase to 2 tablets (579m daily. If doing well on 2537maily, ok to stay there.    Dispense:  46 tablet    Refill:  1    Order Specific  Question:   Supervising Provider    Answer:   ALEEmeterio Reeve0[[id number]] Patient Instructions   Preventive Care 40-57 30ars Old, Female Preventive care refers to lifestyle choices and visits with your health care provider that can promote health and wellness. This includes:  A yearly physical exam. This is also called an annual wellness visit.  Regular dental and eye exams.  Immunizations.  Screening for certain conditions.  Healthy lifestyle choices, such as: ? Eating a healthy diet. ? Getting regular exercise. ? Not using drugs or products that contain nicotine and tobacco. ? Limiting alcohol use. What can I expect for my preventive care visit? Physical exam Your health care provider will check your:  Height and weight. These may be used to calculate your BMI (body mass index). BMI is a measurement that tells if you are at a healthy weight.  Heart rate and blood pressure.  Body temperature.  Skin for abnormal spots. Counseling Your health care provider may ask you questions about your:  Past medical problems.  Family's medical history.  Alcohol, tobacco,  and drug use.  Emotional well-being.  Home life and relationship well-being.  Sexual activity.  Diet, exercise, and sleep habits.  Work and work Statistician.  Access to firearms.  Method of birth control.  Menstrual cycle.  Pregnancy history. What immunizations do I need? Vaccines are usually given at various ages, according to a schedule. Your health care provider will recommend vaccines for you based on your age, medical history, and lifestyle or other factors, such as travel or where you work.   What tests do I need? Blood tests  Lipid and cholesterol levels. These may be checked every 5 years, or more often if you are over 39 years old.  Hepatitis C test.  Hepatitis B test. Screening  Lung cancer screening. You may have this screening every year starting at age 64 if you have a  30-pack-year history of smoking and currently smoke or have quit within the past 15 years.  Colorectal cancer screening. ? All adults should have this screening starting at age 42 and continuing until age 41. ? Your health care provider may recommend screening at age 81 if you are at increased risk. ? You will have tests every 1-10 years, depending on your results and the type of screening test.  Diabetes screening. ? This is done by checking your blood sugar (glucose) after you have not eaten for a while (fasting). ? You may have this done every 1-3 years.  Mammogram. ? This may be done every 1-2 years. ? Talk with your health care provider about when you should start having regular mammograms. This may depend on whether you have a family history of breast cancer.  BRCA-related cancer screening. This may be done if you have a family history of breast, ovarian, tubal, or peritoneal cancers.  Pelvic exam and Pap test. ? This may be done every 3 years starting at age 15. ? Starting at age 50, this may be done every 5 years if you have a Pap test in combination with an HPV test. Other tests  STD (sexually transmitted disease) testing, if you are at risk.  Bone density scan. This is done to screen for osteoporosis. You may have this scan if you are at high risk for osteoporosis. Talk with your health care provider about your test results, treatment options, and if necessary, the need for more tests. Follow these instructions at home: Eating and drinking  Eat a diet that includes fresh fruits and vegetables, whole grains, lean protein, and low-fat dairy products.  Take vitamin and mineral supplements as recommended by your health care provider.  Do not drink alcohol if: ? Your health care provider tells you not to drink. ? You are pregnant, may be pregnant, or are planning to become pregnant.  If you drink alcohol: ? Limit how much you have to 0-1 drink a day. ? Be aware of how much  alcohol is in your drink. In the [place] one drink equals one 12 oz bottle of beer (355 mL), one 5 oz glass of wine (148 mL), or one 1 oz glass of hard liquor (44 mL).   Lifestyle  Take daily care of your teeth and gums. Brush your teeth every morning and night with fluoride toothpaste. Floss one time each day.  Stay active. Exercise for at least 30 minutes 5 or more days each week.  Do not use any products that contain nicotine or tobacco, such as cigarettes, e-cigarettes, and chewing tobacco. If you need help quitting, ask your health care  provider.  Do not use drugs.  If you are sexually active, practice safe sex. Use a condom or other form of protection to prevent STIs (sexually transmitted infections).  If you do not wish to become pregnant, use a form of birth control. If you plan to become pregnant, see your health care provider for a prepregnancy visit.  If told by your health care provider, take low-dose aspirin daily starting at age 81.  Find healthy ways to cope with stress, such as: ? Meditation, yoga, or listening to music. ? Journaling. ? Talking to a trusted person. ? Spending time with friends and family. Safety  Always wear your seat belt while driving or riding in a vehicle.  Do not drive: ? If you have been drinking alcohol. Do not ride with someone who has been drinking. ? When you are tired or distracted. ? While texting.  Wear a helmet and other protective equipment during sports activities.  If you have firearms in your house, make sure you follow all gun safety procedures. What's next?  Visit your health care provider once a year for an annual wellness visit.  Ask your health care provider how often you should have your eyes and teeth checked.  Stay up to date on all vaccines. This information is not intended to replace advice given to you by your health care provider. Make sure you discuss any questions you have with your health care  provider. Document Revised: 09/06/2020 Document Reviewed: 08/14/2018 Elsevier Patient Education  2021 [place].    Weight loss tips and tricks:  1.  Make sure to drink at least 64 ounces of water every day. 2.  Avoid alcohol. 3.  Avoid eating within 3 hours of going to bed. 4.  Cut out sugary drinks such as sweet tea, regular sodas, energy drinks, etc. 5.  Make sure you are getting enough sleep every night. 6.  Start making changes by cutting back portion sizes by 1/3. 7.  Keep a food diary to help identify areas for improvement and promote awareness of bad habits. 8.  Increase your activity.  Choose something you like to do that is fun for you so you are more likely to stick with it. 9.  Do not forget your protein! 10.  Measure your neck, upper arms, waist, hips, and thighs and write those measurements down somewhere before you start your weight loss journey.  Changes in your measurements will tell a far more accurate story than the number on a scale. 11.  As you go, pay attention to how your clothes fit! 12.  Weigh yourself as often or as little as you need to.  Some folks do better weighing every day while others do better with once a week. 13.  Do not get discouraged!  Weight loss efforts are meant to be lifestyle changes.  Once you stop a medication (if you are taking 1), your behaviors and habits will determine if you maintain your weight loss or not.  For those on medications:  1.  Take your medication as prescribed every day first thing in the morning. 2.  Common side effects include nausea and constipation.  To combat this, increased your daily water consumption.  Consider adding in a stool softener (available OTC) if needed.  Also increase fiber intake with vegetables and fruits. 3.  If you have any side effects or concerns while taking medication, please do not hesitate to reach out to [place] here at the office. 4.  While on  weight loss medications, we do require you to  follow-up every 4 weeks with an in office visit.  Refills will not be called in early on controlled substances.  Good luck on your weight loss journey!  Have faith in your self and you will reach your goals!  Tension Headache, Adult A tension headache is a feeling of pain, pressure, or aching over the front and sides of the head. The pain can be dull, or it can feel tight. There are two types of tension headache:  Episodic tension headache. This is when the headaches happen fewer than 15 days a month.  Chronic tension headache. This is when the headaches happen more than 15 days a month during a 40-monthperiod. A tension headache can last from 30 minutes to several days. It is the most common kind of headache. Tension headaches are not normally associated with nausea or vomiting, and they do not get worse with physical activity. What are the causes? The exact cause of this condition is not known. Tension headaches are often triggered by stress, anxiety, or depression. Other triggers may include:  Alcohol.  Too much caffeine or caffeine withdrawal.  Respiratory infections, such as colds, flu, or sinus infections.  Dental problems or teeth clenching.  Fatigue.  Holding your head and neck in the same position for a long period of time, such as while using a computer.  Smoking.  Arthritis of the neck. What are the signs or symptoms? Symptoms of this condition include:  A feeling of pressure or tightness around the head.  Dull, aching head pain.  Pain over the front and sides of the head.  Tenderness in the muscles of the head, neck, and shoulders. How is this diagnosed? This condition may be diagnosed based on your symptoms, your medical history, and a physical exam. If your symptoms are severe or unusual, you may have imaging tests, such as a CT scan or an MRI of your head. Your vision may also be checked. How is this treated? This condition may be treated with lifestyle  changes and with medicines that help relieve symptoms. Follow these instructions at home: Managing pain  Take over-the-counter and prescription medicines only as told by your health care provider.  When you have a headache, lie down in a dark, quiet room.  If directed, put ice on your head and neck. To do this: ? Put ice in a plastic bag. ? Place a towel between your skin and the bag. ? Leave the ice on for 20 minutes, 2-3 times a day. ? Remove the ice if your skin turns bright red. This is very important. If you cannot feel pain, heat, or cold, you have a greater risk of damage to the area.  If directed, apply heat to the back of your neck as often as told by your health care provider. Use the heat source that your health care provider recommends, such as a moist heat pack or a heating pad. ? Place a towel between your skin and the heat source. ? Leave the heat on for 20-30 minutes. ? Remove the heat if your skin turns bright red. This is especially important if you are unable to feel pain, heat, or cold. You have a greater risk of getting burned. Eating and drinking  Eat meals on a regular schedule.  If you drink alcohol: ? Limit how much you have to:  0-1 drink a day for women who are not pregnant.  0-2 drinks a day for  men. ? Know how much alcohol is in your drink. In the [place] one drink equals one 12 oz bottle of beer (355 mL), one 5 oz glass of wine (148 mL), or one 1 oz glass of hard liquor (44 mL).  Drink enough fluid to keep your urine pale yellow.  Decrease your caffeine intake, or stop using caffeine. Lifestyle  Get 7-9 hours of sleep each night, or get the amount of sleep recommended by your health care provider.  At bedtime, remove computers, phones, and tablets from your room.  Find ways to manage your stress. This may include: ? Exercise. ? Deep breathing exercises. ? Yoga. ? Listening to music. ? Positive mental imagery.  Try to sit up straight and avoid  tensing your muscles.  Do not use any products that contain nicotine or tobacco. These include cigarettes, chewing tobacco, and vaping devices, such as e-cigarettes. If you need help quitting, ask your health care provider. General instructions  Avoid any headache triggers. Keep a journal to help find out what may trigger your headaches. For example, write down: ? What you eat and drink. ? How much sleep you get. ? Any change to your diet or medicines.  Keep all follow-up visits. This is important.   Contact a health care provider if:  Your headache does not get better.  Your headache comes back.  You are sensitive to sounds, light, or smells because of a headache.  You have nausea or you vomit.  Your stomach hurts. Get help right away if:  You suddenly develop a severe headache, along with any of the following: ? A stiff neck. ? Nausea and vomiting. ? Confusion. ? Weakness in one part or one side of your body. ? Double vision or loss of vision. ? Shortness of breath. ? Rash. ? Unusual sleepiness. ? Fever or chills. ? Trouble speaking. ? Pain in your eye or ear. ? Trouble walking or balancing. ? Feeling faint or passing out. Summary  A tension headache is a feeling of pain, pressure, or aching over the front and sides of the head.  A tension headache can last from 30 minutes to several days. It is the most common kind of headache.  This condition may be diagnosed based on your symptoms, your medical history, and a physical exam.  This condition may be treated with lifestyle changes and with medicines that help relieve symptoms. This information is not intended to replace advice given to you by your health care provider. Make sure you discuss any questions you have with your health care provider. Document Revised: 09/01/2020 Document Reviewed: 09/01/2020 Elsevier Patient Education  2021 [place].   Follow-up plan: Return in about 4 weeks (around 02/28/2021) for  weight check (ok to use 4pm slot).  Clearnce Sorrel, DNP, APRN, FNP-BC [facility]

## 2021-02-06 LAB — TSH: TSH: 1.44 mIU/L

## 2021-02-06 LAB — LIPID PANEL
Cholesterol: 156 mg/dL (ref <200)
HDL: 63 mg/dL (ref >=50)
LDL Cholesterol (Calc): 80 mg/dL (calc)
Non-HDL Cholesterol (Calc): 93 mg/dL (calc) (ref <130)
Total CHOL/HDL Ratio: 2.5 (calc) (ref <5.0)
Triglycerides: 57 mg/dL (ref <150)

## 2021-02-06 LAB — COMPLETE METABOLIC PANEL WITH GFR
AG Ratio: 1.9 (calc) (ref 1.0–2.5)
ALT: 24 U/L (ref 6–29)
AST: 19 U/L (ref 10–30)
Albumin: 4.2 g/dL (ref 3.6–5.1)
Alkaline phosphatase (APISO): 28 U/L — ABNORMAL LOW (ref 31–125)
BUN: 14 mg/dL (ref 7–25)
CO2: 24 mmol/L (ref 20–32)
Calcium: 9.4 mg/dL (ref 8.6–10.2)
Chloride: 105 mmol/L (ref 98–110)
Creat: 0.77 mg/dL (ref 0.50–1.10)
GFR, Est African American: 109 mL/min/{1.73_m2} (ref >=60)
GFR, Est Non African American: 94 mL/min/{1.73_m2} (ref >=60)
Globulin: 2.2 g/dL (calc) (ref 1.9–3.7)
Glucose, Bld: 70 mg/dL (ref 65–99)
Potassium: 4 mmol/L (ref 3.5–5.3)
Sodium: 137 mmol/L (ref 135–146)
Total Bilirubin: 0.6 mg/dL (ref 0.2–1.2)
Total Protein: 6.4 g/dL (ref 6.1–8.1)

## 2021-02-06 LAB — CBC WITH DIFFERENTIAL/PLATELET
Absolute Monocytes: 403 cells/uL (ref 200–950)
Basophils Absolute: 53 cells/uL (ref 0–200)
Basophils Relative: 1.1 %
Eosinophils Absolute: 72 cells/uL (ref 15–500)
Eosinophils Relative: 1.5 %
HCT: 40.8 % (ref 35.0–45.0)
Hemoglobin: 13.9 g/dL (ref 11.7–15.5)
Lymphs Abs: 1382 cells/uL (ref 850–3900)
MCH: 31 pg (ref 27.0–33.0)
MCHC: 34.1 g/dL (ref 32.0–36.0)
MCV: 91.1 fL (ref 80.0–100.0)
MPV: 9.4 fL (ref 7.5–12.5)
Monocytes Relative: 8.4 %
Neutro Abs: 2890 cells/uL (ref 1500–7800)
Neutrophils Relative %: 60.2 %
Platelets: 272 10*3/uL (ref 140–400)
RBC: 4.48 10*6/uL (ref 3.80–5.10)
RDW: 12.1 % (ref 11.0–15.0)
Total Lymphocyte: 28.8 %
WBC: 4.8 10*3/uL (ref 3.8–10.8)

## 2021-02-28 DIAGNOSIS — Z6829 Body mass index (BMI) 29.0-29.9, adult: Secondary | ICD-10-CM | POA: Insufficient documentation

## 2021-03-01 ENCOUNTER — Encounter: Payer: Self-pay | Admitting: Medical-Surgical

## 2021-03-01 ENCOUNTER — Other Ambulatory Visit: Payer: Self-pay

## 2021-03-01 ENCOUNTER — Ambulatory Visit: Payer: BC Managed Care – PPO | Admitting: Medical-Surgical

## 2021-03-01 VITALS — BP 113/75 | HR 76 | Temp 98.5°F | Ht 60.0 in | Wt 143.9 lb

## 2021-03-01 DIAGNOSIS — Z6828 Body mass index (BMI) 28.0-28.9, adult: Secondary | ICD-10-CM

## 2021-03-01 DIAGNOSIS — R635 Abnormal weight gain: Secondary | ICD-10-CM | POA: Diagnosis not present

## 2021-03-01 DIAGNOSIS — Z6829 Body mass index (BMI) 29.0-29.9, adult: Secondary | ICD-10-CM

## 2021-03-01 NOTE — Progress Notes (Signed)
Subjective:    CC: weight check  HPI: Pleasant 45 year old female presenting today for weight check on phentermine and Topamax.  She started both medications 1 month ago and has done extremely well with them.  She is tolerating both medications well without side effects.  Notes that her appetite has been significantly reduced and she is eating smaller portions.  Has had a little more hunger over the last 2 to 3 days but is able to manage this well.  She is exercising approximately 3 times weekly doing YouTube videos and some weight training.  She also walks her dog regularly.  Sleeping well overall.  She is trying to get plenty of water on a daily basis.  Notes that since she started the Topamax at 25 mg daily, she has not had headaches like she was.  The only headache she has had since our last visit started last night and was still present on awakening this morning.  Denies fever, chills, shortness of breath, chest pain, palpitations, and constipation.  I reviewed the past medical history, family history, social history, surgical history, and allergies today and no changes were needed.  Please see the problem list section below in epic for further details.  Past Medical History: Past Medical History:  Diagnosis Date  . Chronic headaches    Past Surgical History: Past Surgical History:  Procedure Laterality Date  . BREAST EXCISIONAL BIOPSY Left    Social History: Social History   Socioeconomic History  . Marital status: Married    Spouse name: Not on file  . Number of children: 2  . Years of education: Not on file  . Highest education level: Not on file  Occupational History  . Occupation: Pharmacist, hospital  Tobacco Use  . Smoking status: Never Smoker  . Smokeless tobacco: Never Used  Vaping Use  . Vaping Use: Never used  Substance and Sexual Activity  . Alcohol use: Never  . Drug use: Never  . Sexual activity: Yes    Partners: Male    Birth control/protection: I.U.D.  Other Topics  Concern  . Not on file  Social History Narrative  . Not on file   Social Determinants of Health   Financial Resource Strain: Not on file  Food Insecurity: Not on file  Transportation Needs: Not on file  Physical Activity: Not on file  Stress: Not on file  Social Connections: Not on file   Family History: Family History  Problem Relation Age of Onset  . Breast cancer Maternal Aunt 34  . Lung cancer Maternal Aunt   . Lung cancer Maternal Grandmother   . Leukemia Paternal Grandfather    Allergies: Allergies  Allergen Reactions  . Codeine Nausea And Vomiting   Medications: See med rec.  Review of Systems: See HPI for pertinent positives and negatives.   Objective:    General: Well Developed, well nourished, and in no acute distress.  Neuro: Alert and oriented x3.  HEENT: Normocephalic, atraumatic.  Skin: Warm and dry. Cardiac: Regular rate and rhythm, no murmurs rubs or gallops, no lower extremity edema.  Respiratory: Clear to auscultation bilaterally. Not using accessory muscles, speaking in full sentences.  Impression and Recommendations:    1. Weight gain/BMI 28-28.9 She has done very well on phentermine and Topamax together.  Approximately 9 pound weight loss in the last 4 weeks.  Aim for increase in physical activity.  Discussed further dietary modifications.  Continue phentermine 1/2 tablet daily with Topamax 25 mg daily.  If she notes that  her headaches become more frequent, okay to increase Topamax to 50 mg daily.  Return in about 4 weeks (around 03/29/2021) for weight check. ___________________________________________ Clearnce Sorrel, DNP, APRN, FNP-BC [facility] [place]

## 2021-03-21 ENCOUNTER — Other Ambulatory Visit: Payer: Self-pay

## 2021-03-21 MED ORDER — BUPROPION HCL ER (XL) 300 MG PO TB24: 300.0000 mg | ORAL_TABLET | Freq: Every day | ORAL | 0 refills | Status: DC

## 2021-03-27 ENCOUNTER — Ambulatory Visit: Payer: BC Managed Care – PPO | Admitting: Medical-Surgical

## 2021-03-27 ENCOUNTER — Other Ambulatory Visit: Payer: Self-pay

## 2021-03-27 ENCOUNTER — Encounter: Payer: Self-pay | Admitting: Medical-Surgical

## 2021-03-27 VITALS — BP 101/70 | HR 80 | Temp 98.4°F | Ht 60.0 in | Wt 137.3 lb

## 2021-03-27 DIAGNOSIS — R635 Abnormal weight gain: Secondary | ICD-10-CM

## 2021-03-27 MED ORDER — PHENTERMINE HCL 37.5 MG PO TABS: ORAL_TABLET | ORAL | 0 refills | Status: DC

## 2021-03-27 MED ORDER — TOPIRAMATE 25 MG PO TABS: 25.0000 mg | ORAL_TABLET | Freq: Two times a day (BID) | ORAL | 1 refills | Status: DC

## 2021-03-27 NOTE — Progress Notes (Signed)
  Subjective:    CC: weight check  HPI: Pleasant 45 year old female presenting for weight check on phentermine and topamax. Taking 1/2 tab phentermine daily along with topamax 25mg  daily. Tolerating both medications well without side effects. Continues to note appetite suppression and is eating smaller portions. Exercising 2-3 times weekly, mostly doing weights/strength training. Sleeping well. Goal weight is 120-125lbs. No fevers, chills, SOB, chest pain, palpitations, or constipation.   Did not increase Topamax to 25mg  BID since she was doing well at the once daily dose. Did have a few headaches two weeks ago and considered increasing it but last week had no headaches so decided to stay where she was.   I reviewed the past medical history, family history, social history, surgical history, and allergies today and no changes were needed.  Please see the problem list section below in epic for further details.  Past Medical History: Past Medical History:  Diagnosis Date  . Chronic headaches    Past Surgical History: Past Surgical History:  Procedure Laterality Date  . BREAST EXCISIONAL BIOPSY Left    Social History: Social History   Socioeconomic History  . Marital status: Married    Spouse name: Not on file  . Number of children: 2  . Years of education: Not on file  . Highest education level: Not on file  Occupational History  . Occupation: Pharmacist, hospital  Tobacco Use  . Smoking status: Never Smoker  . Smokeless tobacco: Never Used  Vaping Use  . Vaping Use: Never used  Substance and Sexual Activity  . Alcohol use: Never  . Drug use: Never  . Sexual activity: Yes    Partners: Male    Birth control/protection: I.U.D.  Other Topics Concern  . Not on file  Social History Narrative  . Not on file   Social Determinants of Health   Financial Resource Strain: Not on file  Food Insecurity: Not on file  Transportation Needs: Not on file  Physical Activity: Not on file  Stress:  Not on file  Social Connections: Not on file   Family History: Family History  Problem Relation Age of Onset  . Breast cancer Maternal Aunt 34  . Lung cancer Maternal Aunt   . Lung cancer Maternal Grandmother   . Leukemia Paternal Grandfather    Allergies: Allergies  Allergen Reactions  . Codeine Nausea And Vomiting   Medications: See med rec.  Review of Systems: See HPI for pertinent positives and negatives.   Objective:    General: Well Developed, well nourished, and in no acute distress.  Neuro: Alert and oriented x3.  HEENT: Normocephalic, atraumatic.  Skin: Warm and dry. Cardiac: Regular rate and rhythm, no murmurs rubs or gallops, no lower extremity edema.  Respiratory: Clear to auscultation bilaterally. Not using accessory muscles, speaking in full sentences.   Impression and Recommendations:    1. Weight gain Continue phentermine 37.5mg  1/2 tablet daily. Continue Topamax 25mg  daily, ok to increase to 50mg  daily (or 25mg  BID) if desired for better control of headaches. Continue diet and lifestyle modifications.  - phentermine (ADIPEX-P) 37.5 MG tablet; One-half tab by mouth qAM  Dispense: 30 tablet; Refill: 0  Return in about 4 weeks (around 04/24/2021) for weight check. ___________________________________________ Clearnce Sorrel, DNP, APRN, FNP-BC [facility] [place]

## 2021-03-28 ENCOUNTER — Ambulatory Visit: Payer: BC Managed Care – PPO | Admitting: Medical-Surgical

## 2021-04-10 ENCOUNTER — Other Ambulatory Visit (HOSPITAL_COMMUNITY)
Admission: RE | Admit: 2021-04-10 | Discharge: 2021-04-10 | Disposition: A | Discharge status: Home / Self Care | Payer: BC Managed Care – PPO | Source: Ambulatory Visit | Attending: Obstetrics & Gynecology | Admitting: Obstetrics & Gynecology

## 2021-04-10 ENCOUNTER — Encounter: Payer: Self-pay | Admitting: Obstetrics & Gynecology

## 2021-04-10 ENCOUNTER — Ambulatory Visit (INDEPENDENT_AMBULATORY_CARE_PROVIDER_SITE_OTHER): Payer: BC Managed Care – PPO | Admitting: Obstetrics & Gynecology

## 2021-04-10 ENCOUNTER — Other Ambulatory Visit: Payer: Self-pay

## 2021-04-10 VITALS — BP 114/80 | HR 80 | Resp 16 | Ht 60.0 in | Wt 136.0 lb

## 2021-04-10 DIAGNOSIS — Z30430 Encounter for insertion of intrauterine contraceptive device: Secondary | ICD-10-CM

## 2021-04-10 DIAGNOSIS — Z803 Family history of malignant neoplasm of breast: Secondary | ICD-10-CM | POA: Diagnosis not present

## 2021-04-10 DIAGNOSIS — Z01419 Encounter for gynecological examination (general) (routine) without abnormal findings: Secondary | ICD-10-CM

## 2021-04-10 DIAGNOSIS — Z30433 Encounter for removal and reinsertion of intrauterine contraceptive device: Secondary | ICD-10-CM | POA: Diagnosis not present

## 2021-04-10 LAB — POCT URINE PREGNANCY: Preg Test, Ur: NEGATIVE

## 2021-04-10 MED ORDER — LEVONORGESTREL 20 MCG/24HR IU IUD: INTRAUTERINE_SYSTEM | Freq: Once | INTRAUTERINE | Status: AC

## 2021-04-10 NOTE — Progress Notes (Addendum)
Subjective:     Carolyn Rodriguez is a 45 y.o. female here for a routine exam.  Current complaints: needs Mirena removed and new one place.  She is at 7 years.  She is very happy with the Mirena and has amenorrhea with it.     Gynecologic History No LMP recorded. (Menstrual status: IUD). Contraception: IUD Last Pap: at [facility] as normal Last mammogram: 06/2020. Results were: normal  Obstetric History OB History  Gravida Para Term Preterm AB Living  2 2 2         SAB IAB Ectopic Multiple Live Births          2    # Outcome Date GA Lbr Len/2nd Weight Sex Delivery Anes PTL Lv  2 Term      Vag-Spont     1 Term      Vag-Spont        The following portions of the patient's history were reviewed and updated as appropriate: allergies, current medications, past family history, past medical history, past social history, past surgical history and problem list.  Review of Systems Pertinent items noted in HPI and remainder of comprehensive ROS otherwise negative.    Objective:      Vitals:   04/10/21 1341  BP: 114/80  Pulse: 80  Resp: 16  Weight: 136 lb (61.7 kg)  Height: 5' (1.524 m)   Vitals:  WNL General appearance: alert, cooperative and no distress  HEENT: Normocephalic, without obvious abnormality, atraumatic Eyes: negative Throat: lips, mucosa, and tongue normal; teeth and gums normal  Respiratory: Clear to auscultation bilaterally  CV: Regular rate and rhythm  Breasts:  Normal appearance, no masses or tenderness, no nipple retraction or dimpling  GI: Soft, non-tender; bowel sounds normal; no masses,  no organomegaly  GU: External Genitalia:  Tanner V, no lesion Urethra:  No prolapse   Vagina: Pink, normal rugae, no blood or discharge  Cervix: No CMT, no lesion, IUD strings at os  Uterus:  Normal size and contour, non tender, retroverted  Adnexa: Normal, no masses, non tender  Musculoskeletal: No edema, redness or tenderness in the calves or thighs   Skin: No lesions or rash  Lymphatic: Axillary adenopathy: none     Psychiatric: Normal mood and behavior        Assessment:    Healthy female exam.    Plan:  1.  Pap with cotesting 2.  Mirena IUD removal and reinsertion 3.  Yearly mammograms 4.  Patient is going to explore Cologuard versus colonoscopy with her insurance company.  She will be 45 tomorrow.  GYNECOLOGY CLINIC PROCEDURE NOTE  Carolyn Rodriguez is a 45 y.o. G2P2000 here for Mirena IUD removal and reinsertion. No GYN concerns and was here for annual exam and to establish care.  IUD Removal and Reinsertion  Patient identified, informed consent performed. Discussed risks of irregular bleeding, cramping, infection, malpositioning or misplacement of the IUD outside the uterus which may require further procedures. Time out was performed. Speculum placed in the vagina. Cervix visualized. Cleaned with Betadine x 2. Grasped anteriorly with a single tooth tenaculum. The strings of the IUD were grasped and pulled using ring forceps. The IUD was successfully removed in its entirety. Uterus sounded to 8 cm. Mirena IUD placed per manufacturer's recommendations. Strings trimmed to 3 cm. Tenaculum was removed, good hemostasis noted. Patient tolerated procedure well. Patient was given post-procedure instructions.  Patient was also asked to check IUD strings periodically and follow up in 4  weeks for IUD check.

## 2021-04-13 LAB — CYTOLOGY - PAP
Comment: NEGATIVE
Diagnosis: NEGATIVE
High risk HPV: NEGATIVE

## 2021-04-25 ENCOUNTER — Ambulatory Visit: Payer: BC Managed Care – PPO | Admitting: Medical-Surgical

## 2021-04-26 ENCOUNTER — Ambulatory Visit: Payer: BC Managed Care – PPO | Admitting: Medical-Surgical

## 2021-05-10 ENCOUNTER — Ambulatory Visit: Payer: BC Managed Care – PPO | Admitting: Medical-Surgical

## 2021-05-10 ENCOUNTER — Other Ambulatory Visit: Payer: Self-pay

## 2021-05-10 ENCOUNTER — Encounter: Payer: Self-pay | Admitting: Medical-Surgical

## 2021-05-10 VITALS — BP 108/72 | HR 75 | Temp 98.4°F | Ht 60.0 in | Wt 135.3 lb

## 2021-05-10 DIAGNOSIS — Z114 Encounter for screening for human immunodeficiency virus [HIV]: Secondary | ICD-10-CM | POA: Diagnosis not present

## 2021-05-10 DIAGNOSIS — R635 Abnormal weight gain: Secondary | ICD-10-CM | POA: Diagnosis not present

## 2021-05-10 DIAGNOSIS — Z1211 Encounter for screening for malignant neoplasm of colon: Secondary | ICD-10-CM

## 2021-05-10 DIAGNOSIS — Z6826 Body mass index (BMI) 26.0-26.9, adult: Secondary | ICD-10-CM | POA: Diagnosis not present

## 2021-05-10 DIAGNOSIS — Z1159 Encounter for screening for other viral diseases: Secondary | ICD-10-CM | POA: Diagnosis not present

## 2021-05-10 MED ORDER — PHENTERMINE HCL 37.5 MG PO TABS: 37.5000 mg | ORAL_TABLET | Freq: Every day | ORAL | 0 refills | Status: DC

## 2021-05-10 MED ORDER — TOPIRAMATE 25 MG PO TABS: 25.0000 mg | ORAL_TABLET | Freq: Two times a day (BID) | ORAL | 1 refills | Status: DC

## 2021-05-10 NOTE — Progress Notes (Signed)
Subjective:    CC: weight check  HPI: Carolyn Rodriguez presenting for weight check. Taking phentermine 37.5mg  1/2 tab and topamax 25mg  daily. Tolerating both medications well without side effects. Not exercising as often as she wants and plans to increase that since school is getting out for the summer. Has room for improvement in her diet. Doesn't like to cook and would prefer to do quick options like protein bars or drinks. Indulges in ice cream in the evenings, usually after 8pm but has been trying to cut back how much she eats. Denies chest pain, shortness of breath, palpitations, and constipation.    I reviewed the past medical history, family history, social history, surgical history, and allergies today and no changes were needed.  Please see the problem list section below in epic for further details.  Past Medical History: Past Medical History:  Diagnosis Date  . Chronic headaches    Past Surgical History: Past Surgical History:  Procedure Laterality Date  . BREAST EXCISIONAL BIOPSY Left    Social History: Social History   Socioeconomic History  . Marital status: Married    Spouse name: Not on file  . Number of children: 2  . Years of education: Not on file  . Highest education level: Not on file  Occupational History  . Occupation: Pharmacist, hospital  Tobacco Use  . Smoking status: Never Smoker  . Smokeless tobacco: Never Used  Vaping Use  . Vaping Use: Never used  Substance and Sexual Activity  . Alcohol use: Never  . Drug use: Never  . Sexual activity: Yes    Partners: Male    Birth control/protection: I.U.D.  Other Topics Concern  . Not on file  Social History Narrative  . Not on file   Social Determinants of Health   Financial Resource Strain: Not on file  Food Insecurity: Not on file  Transportation Needs: Not on file  Physical Activity: Not on file  Stress: Not on file  Social Connections: Not on file   Family History: Family History  Problem  Relation Age of Onset  . Breast cancer Maternal Aunt 34  . Lung cancer Maternal Aunt   . Lung cancer Maternal Grandmother   . Leukemia Paternal Grandfather    Allergies: Allergies  Allergen Reactions  . Codeine Nausea And Vomiting   Medications: See med rec.  Review of Systems: See HPI for pertinent positives and negatives.   Objective:    General: Well Developed, well nourished, and in no acute distress.  Neuro: Alert and oriented x3.  HEENT: Normocephalic, atraumatic.  Skin: Warm and dry. Cardiac: Regular rate and rhythm, no murmurs rubs or gallops, no lower extremity edema.  Respiratory: Clear to auscultation bilaterally. Not using accessory muscles, speaking in full sentences.  Impression and Recommendations:    1. Weight gain 2. Body mass index (BMI) 26.0-26.9, adult Increase phentermine to 1 full tablet daily. Continue Topamax at 25mg  daily for the next 2 weeks then if not doing better with weight loss and appetite management, increase to 50mg  daily.  - phentermine (ADIPEX-P) 37.5 MG tablet; Take 1 tablet (37.5 mg total) by mouth daily before breakfast.  Dispense: 30 tablet; Refill: 0 - topiramate (TOPAMAX) 25 MG tablet; Take 1 tablet (25 mg total) by mouth 2 (two) times daily.  Dispense: 60 tablet; Refill: 1  3. Encounter for screening for HIV 4. Need for hepatitis C screening test Discussed screening recommendations. Ordering today for collection with next blood draw.  - HIV Antibody (routine testing w  rflx) - Hepatitis C antibody  5. Colon cancer screening Referring to GI for colonoscopy.  - Ambulatory referral to Gastroenterology  Return in about 4 weeks (around 06/07/2021) for weight check (ok to put in a 4pm slot). ___________________________________________ Clearnce Sorrel, DNP, APRN, FNP-BC [facility] [place]

## 2021-05-22 ENCOUNTER — Ambulatory Visit (INDEPENDENT_AMBULATORY_CARE_PROVIDER_SITE_OTHER): Payer: BC Managed Care – PPO | Admitting: Obstetrics & Gynecology

## 2021-05-22 ENCOUNTER — Other Ambulatory Visit: Payer: Self-pay

## 2021-05-22 ENCOUNTER — Encounter: Payer: Self-pay | Admitting: Obstetrics & Gynecology

## 2021-05-22 ENCOUNTER — Other Ambulatory Visit: Payer: Self-pay | Admitting: Obstetrics & Gynecology

## 2021-05-22 ENCOUNTER — Encounter: Payer: Self-pay | Admitting: Gastroenterology

## 2021-05-22 VITALS — BP 103/70 | HR 90 | Ht 60.0 in | Wt 135.0 lb

## 2021-05-22 DIAGNOSIS — Z30431 Encounter for routine checking of intrauterine contraceptive device: Secondary | ICD-10-CM

## 2021-05-22 DIAGNOSIS — N6320 Unspecified lump in the left breast, unspecified quadrant: Secondary | ICD-10-CM | POA: Diagnosis not present

## 2021-05-22 NOTE — Progress Notes (Signed)
No issues with IUD Pt feels lump in breast

## 2021-05-23 NOTE — Progress Notes (Signed)
   Subjective:    Patient ID: Carolyn Rodriguez, female    DOB: [birth year], 45 y.o.   MRN: [id number]  HPI  Carolyn Rodriguez is a 45 yo female here for IUD string check and evaluation of newlyfound left reast lump.  Pt Had IUD removed and inserted last month.  She is not having any issues--no pain, bleeding.  After our annual exam and education on self breast exams she found a lump in the upp outer quadrant of left breast. Pt is due for her screening mammogram next month  Review of Systems  Constitutional: Negative.   Respiratory: Negative.   Cardiovascular: Negative.   Gastrointestinal: Negative.   Genitourinary: Negative.        Objective:   Physical Exam Vitals reviewed.  Constitutional:      General: She is not in acute distress.    Appearance: She is well-developed.  HENT:     Head: Normocephalic and atraumatic.  Eyes:     Conjunctiva/sclera: Conjunctivae normal.  Cardiovascular:     Rate and Rhythm: Normal rate.  Pulmonary:     Effort: Pulmonary effort is normal.  Chest:    Abdominal:     General: Abdomen is flat.     Palpations: Abdomen is soft.     Tenderness: There is no abdominal tenderness.  Skin:    General: Skin is warm and dry.  Neurological:     Mental Status: She is alert and oriented to person, place, and time.  Psychiatric:        Mood and Affect: Mood normal.    Vitals:   05/22/21 1411  BP: 103/70  Pulse: 90  Weight: 135 lb (61.2 kg)  Height: 5' (1.524 m)    Assessment & Plan:  45 yo female with newly inserted IUD and left breast lump  1.  IUD in place--strings short and reachable 2.  Diagnostic mammogram of left breast and needs screening mammogram.

## 2021-06-07 ENCOUNTER — Ambulatory Visit: Payer: BC Managed Care – PPO | Admitting: Medical-Surgical

## 2021-06-07 ENCOUNTER — Encounter: Payer: Self-pay | Admitting: Medical-Surgical

## 2021-06-07 ENCOUNTER — Other Ambulatory Visit: Payer: Self-pay

## 2021-06-07 VITALS — BP 96/64 | HR 82 | Temp 98.5°F | Ht 60.0 in | Wt 130.6 lb

## 2021-06-07 DIAGNOSIS — R635 Abnormal weight gain: Secondary | ICD-10-CM

## 2021-06-07 DIAGNOSIS — Z6826 Body mass index (BMI) 26.0-26.9, adult: Secondary | ICD-10-CM | POA: Diagnosis not present

## 2021-06-07 DIAGNOSIS — F419 Anxiety disorder, unspecified: Secondary | ICD-10-CM

## 2021-06-07 MED ORDER — BUPROPION HCL ER (SR) 150 MG PO TB12: 150.0000 mg | ORAL_TABLET | Freq: Every day | ORAL | 0 refills | Status: DC

## 2021-06-07 MED ORDER — PHENTERMINE HCL 37.5 MG PO TABS: 37.5000 mg | ORAL_TABLET | Freq: Every day | ORAL | 0 refills | Status: DC

## 2021-06-07 MED ORDER — BUPROPION HCL ER (XL) 300 MG PO TB24: 300.0000 mg | ORAL_TABLET | Freq: Every day | ORAL | 3 refills | Status: DC

## 2021-06-07 NOTE — Patient Instructions (Signed)
Reverse dieting

## 2021-06-07 NOTE — Progress Notes (Signed)
Subjective:    CC: Weight check  HPI: Pleasant 45 year old female presenting today for weight check on phentermine and Topamax.  She has been taking phentermine 37.5 mg daily, tolerating well without side effects.  She is also taking Topamax 25 mg once daily.  She tried to increase this to 25 mg twice daily but noted that she began to have significant regular headaches.  She backed off the medication and went back to 25 mg daily and is tolerating this well without side effects.  Feels that her appetite has definitely been suppressed with the use of the medications.  She is now doing portion control and eating a lower calorie diet.  She is having a bit of difficulty figuring out what to eat and what works best for her.  Making sure to exercise 2 to 3 days weekly doing cardio at times but mostly focusing on weights.  Her goal weight is 120-125 pounds and she has successfully lost 22 pounds already.  Denies fever, chills, chest pain, shortness of breath, and GI side effects.  Taking Wellbutrin 300 mg daily, tolerating well without side effects.  Feels this is working fairly well for her mood control but wonders if a change of dose may make it just a bit better.  She is worried about weight gain but would also like to have better control over the anxiety and stress at times.  He is open to discussed options that may be beneficial.  Denies SI/HI.  I reviewed the past medical history, family history, social history, surgical history, and allergies today and no changes were needed.  Please see the problem list section below in epic for further details.  Past Medical History: Past Medical History:  Diagnosis Date   Chronic headaches    Past Surgical History: Past Surgical History:  Procedure Laterality Date   BREAST EXCISIONAL BIOPSY Left    Social History: Social History   Socioeconomic History   Marital status: Married    Spouse name: Not on file   Number of children: 2   Years of education:  Not on file   Highest education level: Not on file  Occupational History   Occupation: Teacher  Tobacco Use   Smoking status: Never   Smokeless tobacco: Never  Vaping Use   Vaping Use: Never used  Substance and Sexual Activity   Alcohol use: Never   Drug use: Never   Sexual activity: Yes    Partners: Male    Birth control/protection: I.U.D.  Other Topics Concern   Not on file  Social History Narrative   Not on file   Social Determinants of Health   Financial Resource Strain: Not on file  Food Insecurity: Not on file  Transportation Needs: Not on file  Physical Activity: Not on file  Stress: Not on file  Social Connections: Not on file   Family History: Family History  Problem Relation Age of Onset   Breast cancer Maternal Aunt 34   Lung cancer Maternal Aunt    Lung cancer Maternal Grandmother    Leukemia Paternal Grandfather    Allergies: Allergies  Allergen Reactions   Codeine Nausea And Vomiting   Medications: See med rec.  Review of Systems: See HPI for pertinent positives and negatives.   Objective:    General: Well Developed, well nourished, and in no acute distress.  Neuro: Alert and oriented x3.  HEENT: Normocephalic, atraumatic.  Skin: Warm and dry. Cardiac: Regular rate and rhythm, no murmurs rubs or gallops, no lower  extremity edema.  Respiratory: Clear to auscultation bilaterally. Not using accessory muscles, speaking in full sentences.   Impression and Recommendations:    1. Weight gain 2. Body mass index (BMI) 26.0-26.9, adult Excellent result so far.  Continue phentermine 37.5 mg and Topamax 25 mg daily.  Consider taking 50 mg Topamax once daily to see if this is beneficial without causing headaches.  If not, okay to resume 25 mg daily dosing.  Sinew dietary and lifestyle modifications. - phentermine (ADIPEX-P) 37.5 MG tablet; Take 1 tablet (37.5 mg total) by mouth daily before breakfast.  Dispense: 30 tablet; Refill: 0  3.   Anxiety Discussed options for treating anxiety.  She is doing well at 300 mg Wellbutrin but this is a middle-of-the-road dose.  We will attempt to increase to 450 mg daily to see if this will be a little more helpful.  Advised patient to try this for several days to see if she notices a difference.  Patient verbalized understanding and is agreeable to the plan.  Return in about 4 weeks (around 07/05/2021) for weight check. ___________________________________________ Clearnce Sorrel, DNP, APRN, FNP-BC [facility] [place]

## 2021-06-12 ENCOUNTER — Encounter: Payer: Self-pay | Admitting: Medical-Surgical

## 2021-06-15 ENCOUNTER — Other Ambulatory Visit: Payer: Self-pay

## 2021-06-15 ENCOUNTER — Ambulatory Visit (AMBULATORY_SURGERY_CENTER): Payer: BC Managed Care – PPO

## 2021-06-15 VITALS — Ht 60.0 in | Wt 130.0 lb

## 2021-06-15 DIAGNOSIS — Z1211 Encounter for screening for malignant neoplasm of colon: Secondary | ICD-10-CM

## 2021-06-15 MED ORDER — GOLYTELY 236 G PO SOLR: 4000.0000 mL | ORAL | 0 refills | Status: DC

## 2021-06-15 NOTE — Progress Notes (Signed)
No egg or soy allergy known to patient  No issues with past sedation with any surgeries or procedures Patient denies ever being told they had issues or difficulty with intubation  No FH of Malignant Hyperthermia No diet pills per patient No home 02 use per patient  No blood thinners per patient  Pt reports issues with constipation --will advise pt to use Miralax 5 days prior to prep No A fib or A flutter  EMMI video via MyChart  COVID 19 guidelines implemented in PV today with Pt and RN   NO PA's for preps discussed with pt in PV today  Discussed with pt there will be an out-of-pocket cost for prep and that varies from $0 to 70 dollars   Due to the COVID-19 pandemic we are asking patients to follow certain guidelines.  Pt aware of COVID protocols and [facility] guidelines

## 2021-06-21 ENCOUNTER — Encounter: Payer: BC Managed Care – PPO | Admitting: Gastroenterology

## 2021-06-30 ENCOUNTER — Encounter: Payer: Self-pay | Admitting: Medical-Surgical

## 2021-07-03 ENCOUNTER — Encounter: Payer: Self-pay | Admitting: Gastroenterology

## 2021-07-03 ENCOUNTER — Other Ambulatory Visit: Payer: Self-pay

## 2021-07-03 ENCOUNTER — Ambulatory Visit (AMBULATORY_SURGERY_CENTER): Payer: BC Managed Care – PPO | Admitting: Gastroenterology

## 2021-07-03 VITALS — BP 106/70 | HR 73 | Temp 98.9°F | Resp 17 | Ht 60.0 in | Wt 130.0 lb

## 2021-07-03 DIAGNOSIS — K635 Polyp of colon: Secondary | ICD-10-CM

## 2021-07-03 DIAGNOSIS — Z1211 Encounter for screening for malignant neoplasm of colon: Secondary | ICD-10-CM

## 2021-07-03 DIAGNOSIS — D122 Benign neoplasm of ascending colon: Secondary | ICD-10-CM

## 2021-07-03 MED ORDER — SODIUM CHLORIDE 0.9 % IV SOLN: 500.0000 mL | INTRAVENOUS | Status: DC

## 2021-07-03 NOTE — Progress Notes (Signed)
Called to room to assist during endoscopic procedure.  Patient ID and intended procedure confirmed with present staff. Received instructions for my participation in the procedure from the performing physician.  

## 2021-07-03 NOTE — Op Note (Signed)
[place] Carolyn Rodriguez Name: Carolyn Rodriguez Procedure Date: 07/03/2021 3:23 PM MRN: [id number] Endoscopist: Nicki Reaper E. Candis Schatz , MD Age: 45 Referring MD:  Date of Birth: 1976/12/07 Gender: Female Account #: [id number] Procedure:                Colonoscopy Indications:              Screening for colorectal malignant neoplasm, This                            is the Carolyn Rodriguez's first colonoscopy Medicines:                Monitored Anesthesia Care Procedure:                Pre-Anesthesia Assessment:                           - Prior to the procedure, a History and Physical                            was performed, and Carolyn Rodriguez medications and                            allergies were reviewed. The Carolyn Rodriguez's tolerance of                            previous anesthesia was also reviewed. The risks                            and benefits of the procedure and the sedation                            options and risks were discussed with the Carolyn Rodriguez.                            All questions were answered, and informed consent                            was obtained. Prior Anticoagulants: The Carolyn Rodriguez has                            taken no previous anticoagulant or antiplatelet                            agents. ASA Grade Assessment: II - A Carolyn Rodriguez with                            mild systemic disease. After reviewing the risks                            and benefits, the Carolyn Rodriguez was deemed in                            satisfactory condition to undergo the procedure.  After obtaining informed consent, the colonoscope                            was passed under direct vision. Throughout the                            procedure, the Carolyn Rodriguez's blood pressure, pulse, and                            oxygen saturations were monitored continuously. The                            Olympus CF-HQ190L [id number]) Colonoscope was                            introduced through  the anus and advanced to the the                            terminal ileum, with identification of the                            appendiceal orifice and IC valve. The colonoscopy                            was performed without difficulty. The Carolyn Rodriguez                            tolerated the procedure well. The quality of the                            bowel preparation was good. The terminal ileum,                            ileocecal valve, appendiceal orifice, and rectum                            were photographed. Scope In: 3:33:03 PM Scope Out: 3:51:10 PM Scope Withdrawal Time: 0 hours 13 minutes 52 seconds  Total Procedure Duration: 0 hours 18 minutes 7 seconds  Findings:                 The perianal and digital rectal examinations were                            normal. Pertinent negatives include normal                            sphincter tone and no palpable rectal lesions.                           A 5 mm polyp was found in the ascending colon. The                            polyp was flat. The polyp was removed with a cold  snare. Resection and retrieval were complete.                            Estimated blood loss was minimal.                           A few small-mouthed diverticula were found in the                            sigmoid colon.                           The exam was otherwise normal throughout the                            examined colon.                           The terminal ileum appeared normal.                           The retroflexed view of the distal rectum and anal                            verge was normal and showed no anal or rectal                            abnormalities. Complications:            No immediate complications. Estimated Blood Loss:     Estimated blood loss was minimal. Impression:               - One 5 mm polyp in the ascending colon, removed                            with a cold snare. Resected  and retrieved.                           - Diverticulosis in the sigmoid colon.                           - The examined portion of the ileum was normal.                           - The distal rectum and anal verge are normal on                            retroflexion view. Recommendation:           - Carolyn Rodriguez has a contact number available for                            emergencies. The signs and symptoms of potential                            delayed complications were discussed with the  Carolyn Rodriguez. Return to normal activities tomorrow.                            Written discharge instructions were provided to the                            Carolyn Rodriguez.                           - Resume previous diet.                           - Continue present medications.                           - Await pathology results.                           - Repeat colonoscopy in 7-10 years for surveillance. Camilah Spillman E. Candis Schatz, MD 07/03/2021 3:57:17 PM This report has been signed electronically.

## 2021-07-03 NOTE — Patient Instructions (Signed)
Information on polyps and diverticulosis given to you today.  Await pathology results.  Resume previous diet and medications.  Repeat colonoscopy in 7-10 years.  YOU HAD AN ENDOSCOPIC PROCEDURE TODAY AT [facility]:   Refer to the procedure report that was given to you for any specific questions about what was found during the examination.  If the procedure report does not answer your questions, please call your gastroenterologist to clarify.  If you requested that your care partner not be given the details of your procedure findings, then the procedure report has been included in a sealed envelope for you to review at your convenience later.  YOU SHOULD EXPECT: Some feelings of bloating in the abdomen. Passage of more gas than usual.  Walking can help get rid of the air that was put into your GI tract during the procedure and reduce the bloating. If you had a lower endoscopy (such as a colonoscopy or flexible sigmoidoscopy) you may notice spotting of blood in your stool or on the toilet paper. If you underwent a bowel prep for your procedure, you may not have a normal bowel movement for a few days.  Please Note:  You might notice some irritation and congestion in your nose or some drainage.  This is from the oxygen used during your procedure.  There is no need for concern and it should clear up in a day or so.  SYMPTOMS TO REPORT IMMEDIATELY:  Following lower endoscopy (colonoscopy or flexible sigmoidoscopy):  Excessive amounts of blood in the stool  Significant tenderness or worsening of abdominal pains  Swelling of the abdomen that is new, acute  Fever of 100F or higher   For urgent or emergent issues, a gastroenterologist can be reached at any hour by calling [phone]. Do not use MyChart messaging for urgent concerns.    DIET:  We do recommend a small meal at first, but then you may proceed to your regular diet.  Drink plenty of fluids but you should avoid  alcoholic beverages for 24 hours.  ACTIVITY:  You should plan to take it easy for the rest of today and you should NOT DRIVE or use heavy machinery until tomorrow (because of the sedation medicines used during the test).    FOLLOW UP: Our staff will call the number listed on your records 48-72 hours following your procedure to check on you and address any questions or concerns that you may have regarding the information given to you following your procedure. If we do not reach you, we will leave a message.  We will attempt to reach you two times.  During this call, we will ask if you have developed any symptoms of COVID 19. If you develop any symptoms (ie: fever, flu-like symptoms, shortness of breath, cough etc.) before then, please call [phone].  If you test positive for Covid 19 in the 2 weeks post procedure, please call and report this information to [place].    If any biopsies were taken you will be contacted by phone or by letter within the next 1-3 weeks.  Please call us at [phone] if you have not heard about the biopsies in 3 weeks.    SIGNATURES/CONFIDENTIALITY: You and/or your care partner have signed paperwork which will be entered into your electronic medical record.  These signatures attest to the fact that that the information above on your After Visit Summary has been reviewed and is understood.  Full responsibility of the confidentiality of this discharge  information lies with you and/or your care-partner.

## 2021-07-03 NOTE — Progress Notes (Signed)
Pt's states no medical or surgical changes since previsit or office visit. 

## 2021-07-03 NOTE — Progress Notes (Signed)
Report to PACU, RN, vss, BBS= Clear.  

## 2021-07-05 ENCOUNTER — Ambulatory Visit: Payer: BC Managed Care – PPO | Admitting: Medical-Surgical

## 2021-07-05 ENCOUNTER — Ambulatory Visit
Admission: RE | Admit: 2021-07-05 | Discharge: 2021-07-05 | Disposition: A | Discharge status: Home / Self Care | Payer: BC Managed Care – PPO | Source: Ambulatory Visit | Attending: Obstetrics & Gynecology | Admitting: Obstetrics & Gynecology

## 2021-07-05 ENCOUNTER — Telehealth: Payer: Self-pay | Admitting: *Deleted

## 2021-07-05 ENCOUNTER — Other Ambulatory Visit: Payer: Self-pay

## 2021-07-05 VITALS — BP 107/75 | HR 85 | Ht 60.0 in | Wt 127.0 lb

## 2021-07-05 DIAGNOSIS — N6320 Unspecified lump in the left breast, unspecified quadrant: Secondary | ICD-10-CM

## 2021-07-05 DIAGNOSIS — R635 Abnormal weight gain: Secondary | ICD-10-CM

## 2021-07-05 DIAGNOSIS — Z6825 Body mass index (BMI) 25.0-25.9, adult: Secondary | ICD-10-CM

## 2021-07-05 MED ORDER — PHENTERMINE HCL 37.5 MG PO TABS: ORAL_TABLET | ORAL | 0 refills | Status: DC

## 2021-07-05 MED ORDER — BUPROPION HCL ER (SR) 150 MG PO TB12: 150.0000 mg | ORAL_TABLET | Freq: Every day | ORAL | 0 refills | Status: DC

## 2021-07-05 NOTE — Progress Notes (Signed)
  HPI with pertinent ROS:   CC: weight check  HPI: Pleasant 45 year old female presenting today for weight check.  She has been taking phentermine 37.5 mg daily, tolerating well without side effects.  She is also been taking Topamax 25 mg daily.  She had her colonoscopy this past month and had spent 10 days off of phentermine.  During that time, she increased her Topamax 25 mg to twice daily.  She did notice a significant increase in her cravings and hunger when she was off the phentermine.  She has been having frontal headaches about twice a week for the last couple of weeks.  The increase in Topamax did not seem to make a difference for this.  Admits that she is not exercising like she should.  She does continue to make healthier dietary choices and limit portion sizes.  I reviewed the past medical history, family history, social history, surgical history, and allergies today and no changes were needed.  Please see the problem list section below in epic for further details.   Physical exam:   General: Well Developed, well nourished, and in no acute distress.  Neuro: Alert and oriented x3.  HEENT: Normocephalic, atraumatic.  Skin: Warm and dry. Cardiac: Regular rate and rhythm, no murmurs rubs or gallops, no lower extremity edema.  Respiratory: Clear to auscultation bilaterally. Not using accessory muscles, speaking in full sentences.  Impression and Recommendations:    1. Weight gain 2. Body mass index (BMI) 24.0-24.9, adult Has had good results with use of phentermine.  She has lost 3 pounds over the last month but reports this is related to having her colonoscopy completed.  We are right at her goal weight so discussed starting to wean off phentermine.  We are reducing her phentermine to 1/2 tablet daily for the next 4 weeks and then will reduce it slowly to half tab every other day followed by half tab twice a week and then wean off completely.  Discussed continuing Topamax.  She is  tolerating 25 mg twice daily but this is not managing her headaches well.  She does feel that her multivitamin helps some with her headaches and would like to give this 1 more week of taking it since having to stop it for the colonoscopy.  If she still having headaches frequently after the next week, she will increase her Topamax to 50 mg in the morning with 25 mg at night.  Return in about 4 weeks (around 08/02/2021) for weight check. ___________________________________________ Clearnce Sorrel, DNP, APRN, FNP-BC Primary Care and [place]

## 2021-07-05 NOTE — Telephone Encounter (Signed)
  Follow up Call-  Call back number 07/03/2021  Post procedure Call Back phone  # [phone]  Permission to leave phone message Yes  Some recent data might be hidden     Patient questions:  Do you have a fever, pain , or abdominal swelling? No. Pain Score  0 *  Have you tolerated food without any problems? Yes.    Have you been able to return to your normal activities? Yes.    Do you have any questions about your discharge instructions: Diet   No. Medications  No. Follow up visit  No.  Do you have questions or concerns about your Care? No.  Actions: * If pain score is 4 or above: No action needed, pain <4.

## 2021-07-05 NOTE — Telephone Encounter (Signed)
Message left

## 2021-07-10 ENCOUNTER — Encounter: Payer: Self-pay | Admitting: Gastroenterology

## 2021-08-07 ENCOUNTER — Ambulatory Visit: Payer: BC Managed Care – PPO | Admitting: Medical-Surgical

## 2021-08-07 ENCOUNTER — Other Ambulatory Visit: Payer: Self-pay | Admitting: Medical-Surgical

## 2021-08-08 MED ORDER — BUPROPION HCL ER (SR) 150 MG PO TB12: 150.0000 mg | ORAL_TABLET | Freq: Every day | ORAL | 0 refills | Status: DC

## 2021-08-11 ENCOUNTER — Encounter: Payer: Self-pay | Admitting: Medical-Surgical

## 2021-08-11 ENCOUNTER — Ambulatory Visit (INDEPENDENT_AMBULATORY_CARE_PROVIDER_SITE_OTHER): Payer: BC Managed Care – PPO | Admitting: Medical-Surgical

## 2021-08-11 ENCOUNTER — Other Ambulatory Visit: Payer: Self-pay

## 2021-08-11 VITALS — BP 113/78 | HR 74 | Resp 20 | Ht 60.0 in | Wt 125.7 lb

## 2021-08-11 DIAGNOSIS — Z7689 Persons encountering health services in other specified circumstances: Secondary | ICD-10-CM

## 2021-08-11 DIAGNOSIS — G8929 Other chronic pain: Secondary | ICD-10-CM | POA: Diagnosis not present

## 2021-08-11 DIAGNOSIS — Z23 Encounter for immunization: Secondary | ICD-10-CM | POA: Diagnosis not present

## 2021-08-11 DIAGNOSIS — R519 Headache, unspecified: Secondary | ICD-10-CM | POA: Diagnosis not present

## 2021-08-11 DIAGNOSIS — Z6829 Body mass index (BMI) 29.0-29.9, adult: Secondary | ICD-10-CM

## 2021-08-11 DIAGNOSIS — F419 Anxiety disorder, unspecified: Secondary | ICD-10-CM

## 2021-08-11 MED ORDER — TOPIRAMATE 25 MG PO TABS: 25.0000 mg | ORAL_TABLET | Freq: Two times a day (BID) | ORAL | 1 refills | Status: DC

## 2021-08-11 MED ORDER — BUPROPION HCL ER (SR) 150 MG PO TB12: 150.0000 mg | ORAL_TABLET | Freq: Every day | ORAL | 1 refills | Status: DC

## 2021-08-11 NOTE — Progress Notes (Signed)
  HPI with pertinent ROS:   CC: weight check   HPI: Very pleasant 45 year old female presenting today for weight check on phentermine.  We are currently in the weaning process of phentermine and she has been taking one half of a tablet every other day for the last week and a half.  She has been able to note a difference in her appetite but is only able to hold smaller portions since she started the medications.  Notes that ice cream every other night is her biggest weakness and she often overeats with this.  Sometimes she will eat so much ice cream that she does not feel she can eat her dinner.  She is exercising regularly but admits that she should probably be doing more.  Is at her goal weight of 125.7 pounds today and would like to continue with the plan to wean off of the medication.  Doing well on Wellbutrin at 450 mg daily.  Feels the medication is helping quite a bit.  Is due for a refill on the 150 mg tablets.  Headaches have been doing very well lately with Topamax at 25 mg twice daily.  I reviewed the past medical history, family history, social history, surgical history, and allergies today and no changes were needed.  Please see the problem list section below in epic for further details.   Physical exam:   General: Well Developed, well nourished, and in no acute distress.  Neuro: Alert and oriented x3.  HEENT: Normocephalic, atraumatic.  Skin: Warm and dry. Cardiac: Regular rate and rhythm, no murmurs rubs or gallops, no lower extremity edema.  Respiratory: Clear to auscultation bilaterally. Not using accessory muscles, speaking in full sentences.  Impression and Recommendations:    1. Encounter for weight management Continue phentermine taper.  Take 1/2 tablet every other day for 2 weeks, reduce to Monday/Wednesday/Friday for 2 weeks, then twice weekly for 2 weeks then stop.  Continue dietary modifications and exercise.  Being mindful of eating large portions and limit  concentrated sweets as much as possible.  2. Chronic nonintractable headache, unspecified headache type Continue Topamax 25 mg twice daily. - topiramate (TOPAMAX) 25 MG tablet; Take 1 tablet (25 mg total) by mouth 2 (two) times daily.  Dispense: 180 tablet; Refill: 1  3. Anxiety Continue Wellbutrin 450 mg daily.  4. Flu vaccine need Flu vaccine given in office. - Flu Vaccine QUAD 41moIM (Fluarix, Fluzone & Alfiuria Quad PF)  Return in about 4 weeks (around 09/08/2021) for weight check. ___________________________________________ JClearnce Sorrel DNP, APRN, FNP-BC [facility] and [place]

## 2021-08-14 NOTE — Telephone Encounter (Signed)
Left voicemail message for patient to call back to get her appointment scheduled, that you okay'd a 4pm slot for her. AM

## 2021-09-19 ENCOUNTER — Encounter: Payer: Self-pay | Admitting: Medical-Surgical

## 2021-09-19 ENCOUNTER — Ambulatory Visit: Payer: BC Managed Care – PPO | Admitting: Medical-Surgical

## 2021-09-19 ENCOUNTER — Other Ambulatory Visit: Payer: Self-pay

## 2021-09-19 VITALS — BP 109/75 | HR 79 | Resp 20 | Ht 60.0 in | Wt 125.0 lb

## 2021-09-19 DIAGNOSIS — G8929 Other chronic pain: Secondary | ICD-10-CM | POA: Diagnosis not present

## 2021-09-19 DIAGNOSIS — F419 Anxiety disorder, unspecified: Secondary | ICD-10-CM | POA: Diagnosis not present

## 2021-09-19 DIAGNOSIS — R519 Headache, unspecified: Secondary | ICD-10-CM

## 2021-09-19 DIAGNOSIS — Z7689 Persons encountering health services in other specified circumstances: Secondary | ICD-10-CM

## 2021-09-19 NOTE — Progress Notes (Signed)
  HPI with pertinent ROS:   CC: Weight check  HPI: Very pleasant 45 year old female presenting today for a final weight check on phentermine.  She has been tapering off of the phentermine dosing very slowly and is currently taking 15 mg every Monday and every Thursday.  She has done well with the taper and is not having a significant resurgence of appetite.  Continuing to eat small portions and make healthier choices.  She does admit that she is not exercising and knows that she should probably add this in.  Overall is very happy with her results.  Migraines-taking Topamax 25 mg twice daily as prescribed, tolerating well without side effects.  Has not had any headaches since starting the medication and is quite happy with this.  Would like to continue the medication at her current dose/frequency.  Mood-does have some increased anxiety but some of this is situational.  Taking Wellbutrin 450 mg daily, tolerating well without side effects.  Has been on this medication long-term and is hesitant to consider changing off of it at this point.  She feels like her symptoms are manageable at this time and knows that adding in exercise can only help.  I reviewed the past medical history, family history, social history, surgical history, and allergies today and no changes were needed.  Please see the problem list section below in epic for further details.   Physical exam:   General: Well Developed, well nourished, and in no acute distress.  Neuro: Alert and oriented x3.  HEENT: Normocephalic, atraumatic.  Skin: Warm and dry. Cardiac: Regular rate and rhythm.  Respiratory: Not using accessory muscles, speaking in full sentences.  Impression and Recommendations:    1. Encounter for weight management She has done very well overall with her taper off of phentermine.  She is maintaining her goal weight which is fabulous.  Recommend adding in intentional exercise as this will help her weight remains steady  and promote optimal health.  Continue dietary modifications.  2. Chronic nonintractable headache, unspecified headache type Continue Topamax 25 mg twice daily as prescribed.  Advised patient to notify me should she begin to have breakthrough headaches.  3. Anxiety Continue Wellbutrin 450 mg daily.  Return in about 6 months (around 03/20/2022) for Migraine/anxietyfollow up. ___________________________________________ Clearnce Sorrel, DNP, APRN, FNP-BC Primary Care and [place]

## 2022-02-15 ENCOUNTER — Other Ambulatory Visit: Payer: Self-pay | Admitting: Medical-Surgical

## 2022-03-20 ENCOUNTER — Ambulatory Visit: Payer: BC Managed Care – PPO | Admitting: Medical-Surgical

## 2022-04-03 ENCOUNTER — Encounter: Payer: Self-pay | Admitting: Medical-Surgical

## 2022-04-03 DIAGNOSIS — R519 Headache, unspecified: Secondary | ICD-10-CM

## 2022-04-05 MED ORDER — TOPIRAMATE 25 MG PO TABS: 25.0000 mg | ORAL_TABLET | Freq: Two times a day (BID) | ORAL | 0 refills | Status: DC

## 2022-04-17 ENCOUNTER — Ambulatory Visit: Payer: BC Managed Care – PPO | Admitting: Medical-Surgical

## 2022-04-17 ENCOUNTER — Encounter: Payer: Self-pay | Admitting: Medical-Surgical

## 2022-04-17 VITALS — BP 120/81 | HR 78 | Resp 20 | Ht 60.0 in | Wt 132.7 lb

## 2022-04-17 DIAGNOSIS — G8929 Other chronic pain: Secondary | ICD-10-CM

## 2022-04-17 DIAGNOSIS — F419 Anxiety disorder, unspecified: Secondary | ICD-10-CM

## 2022-04-17 DIAGNOSIS — R519 Headache, unspecified: Secondary | ICD-10-CM

## 2022-04-17 MED ORDER — BUPROPION HCL ER (XL) 300 MG PO TB24: 300.0000 mg | ORAL_TABLET | Freq: Every day | ORAL | 3 refills | Status: DC

## 2022-04-17 MED ORDER — BUPROPION HCL ER (SR) 150 MG PO TB12: ORAL_TABLET | ORAL | 3 refills | Status: DC

## 2022-04-17 MED ORDER — TOPIRAMATE 25 MG PO TABS: 25.0000 mg | ORAL_TABLET | Freq: Two times a day (BID) | ORAL | 3 refills | Status: DC

## 2022-04-17 NOTE — Progress Notes (Signed)
?  HPI with pertinent ROS:  ? ?CC: Mood follow up ? ?HPI: ?Pleasant 46 year old female presenting today for the following: ? ?Mood-taking Wellbutrin 450 mg daily, tolerating well without side effects.  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  Wonders what her mood will look like once she is out of school for the summer.  Is not at a point that she wants to make any changes of her medications.  Denies SI/HI. ? 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  Has not had any further issues with headaches since she started the medication and is very happy with the results. ? ?I reviewed the past medical history, family history, social history, surgical history, and allergies today and no changes were needed.  Please see the problem list section below in epic for further details. ? ?Physical exam:  ? ?General: Well Developed, well nourished, and in no acute distress.  ?Neuro: Alert and oriented x3.  ?HEENT: Normocephalic, atraumatic.  ?Skin: Warm and dry. ?Cardiac: Regular rate and rhythm, no murmurs rubs or gallops, no lower extremity edema.  ?Respiratory: Clear to auscultation bilaterally. Not using accessory muscles, speaking in full sentences. ? ?Impression and Recommendations:   ? ?1. Anxiety ?Stable.  Continue Wellbutrin 450 mg daily as prescribed.  Refill sent to pharmacy. ? ?2. Chronic nonintractable headache, unspecified headache type ?Stable.  Continue Topamax 25 mg twice daily as prescribed, refill sent to pharmacy. ?- topiramate (TOPAMAX) 25 MG tablet; Take 1 tablet (25 mg total) by mouth 2 (two) times daily.  Dispense: 180 tablet; Refill: 3 ? ?Return for annual physical exam at your convenience. ?___________________________________________ ?Clearnce Sorrel, DNP, APRN, FNP-BC ?Primary Care and Sports Medicine ?[place] ?

## 2022-05-21 ENCOUNTER — Telehealth: Payer: Self-pay | Admitting: *Deleted

## 2022-05-21 NOTE — Telephone Encounter (Signed)
Returned call from 4:18 PM. Left patient a message to call and schedule annual appointment.

## 2022-06-07 ENCOUNTER — Other Ambulatory Visit: Payer: Self-pay | Admitting: Obstetrics & Gynecology

## 2022-06-07 ENCOUNTER — Other Ambulatory Visit: Payer: Self-pay | Admitting: Obstetrics and Gynecology

## 2022-06-07 DIAGNOSIS — Z1231 Encounter for screening mammogram for malignant neoplasm of breast: Secondary | ICD-10-CM

## 2022-06-27 ENCOUNTER — Encounter: Payer: BC Managed Care – PPO | Admitting: Medical-Surgical

## 2022-07-02 ENCOUNTER — Encounter: Payer: Self-pay | Admitting: Medical-Surgical

## 2022-07-06 NOTE — Progress Notes (Unsigned)
Last Mammogram: Scheduled for 7/26 Last Pap Smear:  04/10/21 Last Colon Screening;  6/22 Seat Belts:   yes Sun Screen:   yes Dental Check Up:  yes Brush & Floss:  yes

## 2022-07-09 ENCOUNTER — Encounter: Payer: Self-pay | Admitting: Obstetrics & Gynecology

## 2022-07-09 ENCOUNTER — Ambulatory Visit: Payer: BC Managed Care – PPO

## 2022-07-09 ENCOUNTER — Ambulatory Visit (INDEPENDENT_AMBULATORY_CARE_PROVIDER_SITE_OTHER): Payer: BC Managed Care – PPO | Admitting: Obstetrics & Gynecology

## 2022-07-09 VITALS — BP 111/76 | HR 72 | Ht 60.0 in | Wt 131.0 lb

## 2022-07-09 DIAGNOSIS — Z01419 Encounter for gynecological examination (general) (routine) without abnormal findings: Secondary | ICD-10-CM | POA: Diagnosis not present

## 2022-07-09 NOTE — Progress Notes (Signed)
Subjective:     Carolyn Rodriguez is a 46 y.o. female here for a routine exam.  Current complaints: none.   Gynecologic History No LMP recorded. (Menstrual status: IUD). Contraception: IUD Last Mammogram: Scheduled for 7/26, last mammogram birads 1 Last Pap Smear:  04/10/21 Last Colon Screening;  6/22 Seat Belts:   yes Sun Screen:   yes Dental Check Up:  yes Brush & Floss:  yes   Obstetric History OB History  Gravida Para Term Preterm AB Living  '2 2 2        '$ SAB IAB Ectopic Multiple Live Births          2    # Outcome Date GA Lbr Len/2nd Weight Sex Delivery Anes PTL Lv  2 Term      Vag-Spont     1 Term      Vag-Spont        The following portions of the patient's history were reviewed and updated as appropriate: allergies, current medications, past family history, past medical history, past social history, past surgical history, and problem list.  Review of Systems Pertinent items noted in HPI and remainder of comprehensive ROS otherwise negative.    Objective:     Vitals:   07/09/22 0921  BP: 111/76  Pulse: 72  Weight: 131 lb (59.4 kg)  Height: 5' (1.524 m)   Vitals:  WNL General appearance: alert, cooperative and no distress  HEENT: Normocephalic, without obvious abnormality, atraumatic Eyes: negative Throat: lips, mucosa, and tongue normal; teeth and gums normal  Respiratory: Clear to auscultation bilaterally  CV: Regular rate and rhythm  Breasts:  Normal appearance, no masses or tenderness, no nipple retraction or dimpling  GI: Soft, non-tender; bowel sounds normal; no masses,  no organomegaly  GU: External Genitalia:  Tanner V, no lesion Urethra:  No prolapse   Vagina: Pink, normal rugae, no blood or discharge  Cervix: No CMT, no lesion,   Uterus:  Normal size and contour, non tender  Adnexa: Normal, no masses, non tender  Musculoskeletal: No edema, redness or tenderness in the calves or thighs  Skin: No lesions or rash  Lymphatic: Axillary  adenopathy: none     Psychiatric: Normal mood and behavior        Assessment:    Healthy female exam.    Plan:    Yearly mammograms Pap due 2025 Colon screening was 6/22 IUD placed 2022-->good for 8 years

## 2022-07-11 ENCOUNTER — Ambulatory Visit
Admission: RE | Admit: 2022-07-11 | Discharge: 2022-07-11 | Disposition: A | Discharge status: Home / Self Care | Payer: BC Managed Care – PPO | Source: Ambulatory Visit | Attending: Obstetrics & Gynecology | Admitting: Obstetrics & Gynecology

## 2022-07-11 DIAGNOSIS — Z1231 Encounter for screening mammogram for malignant neoplasm of breast: Secondary | ICD-10-CM

## 2022-08-03 ENCOUNTER — Encounter: Payer: BC Managed Care – PPO | Admitting: Medical-Surgical

## 2022-09-06 ENCOUNTER — Encounter: Payer: BC Managed Care – PPO | Admitting: Medical-Surgical

## 2022-10-23 ENCOUNTER — Encounter: Payer: Self-pay | Admitting: Medical-Surgical

## 2022-10-23 ENCOUNTER — Ambulatory Visit (INDEPENDENT_AMBULATORY_CARE_PROVIDER_SITE_OTHER): Payer: BC Managed Care – PPO | Admitting: Medical-Surgical

## 2022-10-23 VITALS — BP 112/73 | HR 70 | Ht 60.0 in | Wt 134.0 lb

## 2022-10-23 DIAGNOSIS — Z5181 Encounter for therapeutic drug level monitoring: Secondary | ICD-10-CM

## 2022-10-23 DIAGNOSIS — F419 Anxiety disorder, unspecified: Secondary | ICD-10-CM

## 2022-10-23 DIAGNOSIS — R519 Headache, unspecified: Secondary | ICD-10-CM | POA: Diagnosis not present

## 2022-10-23 DIAGNOSIS — G8929 Other chronic pain: Secondary | ICD-10-CM

## 2022-10-23 DIAGNOSIS — Z0000 Encounter for general adult medical examination without abnormal findings: Secondary | ICD-10-CM | POA: Diagnosis not present

## 2022-10-23 DIAGNOSIS — Z2821 Immunization not carried out because of patient refusal: Secondary | ICD-10-CM

## 2022-10-23 DIAGNOSIS — Z13220 Encounter for screening for lipoid disorders: Secondary | ICD-10-CM

## 2022-10-23 MED ORDER — BUSPIRONE HCL 5 MG PO TABS: 5.0000 mg | ORAL_TABLET | Freq: Two times a day (BID) | ORAL | 0 refills | Status: DC

## 2022-10-23 NOTE — Progress Notes (Signed)
Complete physical exam  Patient: Carolyn Rodriguez   DOB: 05/20/1976   45 y.o. Female  MRN: [id number]  Subjective:    Chief Complaint  Patient presents with   Annual Exam   Carolyn Rodriguez is a 46 y.o. female who presents today for a complete physical exam. She reports consuming a  lactovegetarian  diet.  Does kickboxing classes 1-2 times weekly  She generally feels well. She reports sleeping well. She does not have additional problems to discuss today.    Most recent fall risk assessment:    04/17/2022    4:49 PM  [place] in the past year? 0  Number falls in past yr: 0  Injury with Fall? 0  Risk for fall due to : No Fall Risks  Follow up Falls evaluation completed     Most recent depression screenings:    10/23/2022    2:45 PM 04/17/2022    4:49 PM  PHQ 2/9 Scores  PHQ - 2 Score 0 0  PHQ- 9 Score  2    Vision:Within last year, Dental: No current dental problems and Receives regular dental care, and STD: The patient denies history of sexually transmitted disease.    Patient Care Team: Samuel Bouche, NP as PCP - General (Nurse Practitioner)   Outpatient Medications Prior to Visit  Medication Sig   buPROPion (WELLBUTRIN SR) 150 MG 12 hr tablet TAKE ONE TABLET BY MOUTH DAILY ALONG WITH 300 MG TABLET FOR A TOTAL DAILY DOSE OF 450 MG   buPROPion (WELLBUTRIN XL) 300 MG 24 hr tablet Take 1 tablet (300 mg total) by mouth daily.   Calcium Carbonate-Vit D-Min (CALCIUM 1200 PO) Take 1,200 mg by mouth at bedtime.   levonorgestrel (MIRENA) 20 MCG/24HR IUD by Intrauterine route.   Multiple Vitamins-Minerals (WOMENS DAILY FORMULA PO) Take 1 tablet by mouth at bedtime.   topiramate (TOPAMAX) 25 MG tablet Take 1 tablet (25 mg total) by mouth 2 (two) times daily.   Facility-Administered Medications Prior to Visit  Medication Dose Route Frequency Provider   0.9 %  sodium chloride infusion  500 mL Intravenous Continuous Daryel November, MD    Review of Systems   Constitutional:  Negative for chills, fever, malaise/fatigue and weight loss.  HENT:  Negative for congestion, ear pain, hearing loss, sinus pain and sore throat.   Eyes:  Negative for blurred vision, photophobia and pain.  Respiratory:  Negative for cough, shortness of breath and wheezing.   Cardiovascular:  Negative for chest pain, palpitations and leg swelling.  Gastrointestinal:  Negative for abdominal pain, constipation, diarrhea, heartburn, nausea and vomiting.  Genitourinary:  Negative for dysuria, frequency and urgency.  Musculoskeletal:  Negative for falls and neck pain.  Skin:  Negative for itching and rash.  Neurological:  Negative for dizziness, weakness and headaches.  Endo/Heme/Allergies:  Negative for polydipsia. Does not bruise/bleed easily.  Psychiatric/Behavioral:  Negative for depression, substance abuse and suicidal ideas. The patient is not nervous/anxious.      Objective:    BP 112/73   Pulse 70   Ht 5' (1.524 m)   Wt 134 lb (60.8 kg)   SpO2 95%   BMI 26.17 kg/m    Physical Exam Constitutional:      General: She is not in acute distress.    Appearance: Normal appearance. She is not ill-appearing.  HENT:     Head: Normocephalic and atraumatic.     Right Ear: Tympanic membrane, ear canal and external ear  normal. There is no impacted cerumen.     Left Ear: Tympanic membrane, ear canal and external ear normal. There is no impacted cerumen.     Nose: Nose normal. No congestion or rhinorrhea.     Mouth/Throat:     Mouth: Mucous membranes are moist.     Pharynx: No oropharyngeal exudate or posterior oropharyngeal erythema.  Eyes:     General: No scleral icterus.       Right eye: No discharge.        Left eye: No discharge.     Extraocular Movements: Extraocular movements intact.     Conjunctiva/sclera: Conjunctivae normal.     Pupils: Pupils are equal, round, and reactive to light.  Neck:     Thyroid: No thyromegaly.     Vascular: No carotid bruit or JVD.      Trachea: Trachea normal.  Cardiovascular:     Rate and Rhythm: Normal rate and regular rhythm.     Pulses: Normal pulses.     Heart sounds: Normal heart sounds. No murmur heard.    No friction rub. No gallop.  Pulmonary:     Effort: Pulmonary effort is normal. No respiratory distress.     Breath sounds: Normal breath sounds. No wheezing.  Abdominal:     General: Bowel sounds are normal. There is no distension.     Palpations: Abdomen is soft.     Tenderness: There is no abdominal tenderness. There is no guarding.  Musculoskeletal:        General: Normal range of motion.     Cervical back: Normal range of motion and neck supple.  Skin:    General: Skin is warm and dry.  Neurological:     Mental Status: She is alert and oriented to person, place, and time.     Cranial Nerves: No cranial nerve deficit.  Psychiatric:        Mood and Affect: Mood normal.        Behavior: Behavior normal.        Thought Content: Thought content normal.        Judgment: Judgment normal.   No results found for any visits on 10/23/22.     Assessment & Plan:    Routine Health Maintenance and Physical Exam  Immunization History  Administered Date(s) Administered   Influenza,inj,Quad PF,6+ Mos 08/11/2021   Influenza-Unspecified 10/21/2020   PFIZER(Purple Top)SARS-COV-2 Vaccination 07/20/2020, 08/12/2020, 04/17/2021    Health Maintenance  Topic Date Due   Hepatitis C Screening  Never done   TETANUS/TDAP  Never done   COVID-19 Vaccine (4 - Pfizer series) 06/12/2021   INFLUENZA VACCINE  03/17/2023 (Originally 07/17/2022)   MAMMOGRAM  07/12/2023   PAP SMEAR-Modifier  04/10/2024   COLONOSCOPY (Pts 45-36yr Insurance coverage will need to be confirmed)  07/04/2031   HIV Screening  Completed   HPV VACCINES  Aged Out    Discussed health benefits of physical activity, and encouraged her to engage in regular exercise appropriate for her age and condition.  1. Annual physical exam Checking labs  as below.  Up-to-date on preventative care.  Wellness information provided with AVS. - Lipid panel - COMPLETE METABOLIC PANEL WITH GFR - CBC with Differential/Platelet  2. Anxiety Continue Wellbutrin 450 mg daily.  Adding BuSpar 5-15 mg twice daily to help with anxiety. - busPIRone (BUSPAR) 5 MG tablet; Take 1-3 tablets (5-15 mg total) by mouth 2 (two) times daily.  Dispense: 90 tablet; Refill: 0  3. Chronic nonintractable headache, unspecified headache  type Recent worsening of headaches with several breakthrough headaches on Topamax 25 mg twice daily.  Okay to increase Topamax to 50 mg in the morning with a 25 mg evening dose or 50 mg twice daily if desired to see if this is more helpful.  Patient will let us know if she finds a better dose so we can send in refills.  4. Influenza vaccination declined Has already had her flu vaccination on 09/19/2022.  Return in about 6 months (around 04/23/2023) for mood follow up.   Samuel Bouche, NP

## 2022-10-26 LAB — LIPID PANEL
Cholesterol: 181 mg/dL (ref <200)
HDL: 72 mg/dL (ref >=50)
LDL Cholesterol (Calc): 98 mg/dL (calc)
Non-HDL Cholesterol (Calc): 109 mg/dL (calc) (ref <130)
Total CHOL/HDL Ratio: 2.5 (calc) (ref <5.0)
Triglycerides: 38 mg/dL (ref <150)

## 2022-10-26 LAB — CBC WITH DIFFERENTIAL/PLATELET
Absolute Monocytes: 304 cells/uL (ref 200–950)
Basophils Absolute: 60 cells/uL (ref 0–200)
Basophils Relative: 1.5 %
Eosinophils Absolute: 72 cells/uL (ref 15–500)
Eosinophils Relative: 1.8 %
HCT: 41.4 % (ref 35.0–45.0)
Hemoglobin: 14 g/dL (ref 11.7–15.5)
Lymphs Abs: 1480 cells/uL (ref 850–3900)
MCH: 32 pg (ref 27.0–33.0)
MCHC: 33.8 g/dL (ref 32.0–36.0)
MCV: 94.7 fL (ref 80.0–100.0)
MPV: 9.4 fL (ref 7.5–12.5)
Monocytes Relative: 7.6 %
Neutro Abs: 2084 cells/uL (ref 1500–7800)
Neutrophils Relative %: 52.1 %
Platelets: 294 10*3/uL (ref 140–400)
RBC: 4.37 10*6/uL (ref 3.80–5.10)
RDW: 11.6 % (ref 11.0–15.0)
Total Lymphocyte: 37 %
WBC: 4 10*3/uL (ref 3.8–10.8)

## 2022-10-26 LAB — COMPLETE METABOLIC PANEL WITH GFR
AG Ratio: 1.9 (calc) (ref 1.0–2.5)
ALT: 21 U/L (ref 6–29)
AST: 14 U/L (ref 10–35)
Albumin: 4 g/dL (ref 3.6–5.1)
Alkaline phosphatase (APISO): 26 U/L — ABNORMAL LOW (ref 31–125)
BUN: 10 mg/dL (ref 7–25)
CO2: 27 mmol/L (ref 20–32)
Calcium: 9.4 mg/dL (ref 8.6–10.2)
Chloride: 107 mmol/L (ref 98–110)
Creat: 0.85 mg/dL (ref 0.50–0.99)
Globulin: 2.1 g/dL (calc) (ref 1.9–3.7)
Glucose, Bld: 89 mg/dL (ref 65–99)
Potassium: 4.2 mmol/L (ref 3.5–5.3)
Sodium: 139 mmol/L (ref 135–146)
Total Bilirubin: 0.5 mg/dL (ref 0.2–1.2)
Total Protein: 6.1 g/dL (ref 6.1–8.1)
eGFR: 86 mL/min/{1.73_m2} (ref >=60)

## 2022-11-18 ENCOUNTER — Other Ambulatory Visit: Payer: Self-pay | Admitting: Medical-Surgical

## 2022-11-18 DIAGNOSIS — F419 Anxiety disorder, unspecified: Secondary | ICD-10-CM

## 2022-11-21 ENCOUNTER — Encounter: Payer: Self-pay | Admitting: Medical-Surgical

## 2022-12-17 ENCOUNTER — Other Ambulatory Visit: Payer: Self-pay | Admitting: Medical-Surgical

## 2022-12-17 DIAGNOSIS — F419 Anxiety disorder, unspecified: Secondary | ICD-10-CM

## 2022-12-30 IMAGING — MG DIGITAL DIAGNOSTIC BILAT W/ TOMO W/ CAD
6 of 10 series · 6 of 30 positions shown · non-contrast
Comparison: Previous exam(s).

CLINICAL DATA: Patient reports a lump outer quadrant left breast.

EXAM:
DIGITAL DIAGNOSTIC BILATERAL MAMMOGRAM WITH TOMOSYNTHESIS AND CAD;
ULTRASOUND LEFT BREAST LIMITED
TECHNIQUE: Bilateral digital diagnostic mammography and breast tomosynthesis
was performed. The images were evaluated with computer-aided
detection.; Targeted ultrasound examination of the left breast was
performed

[L MLO synth-2D]
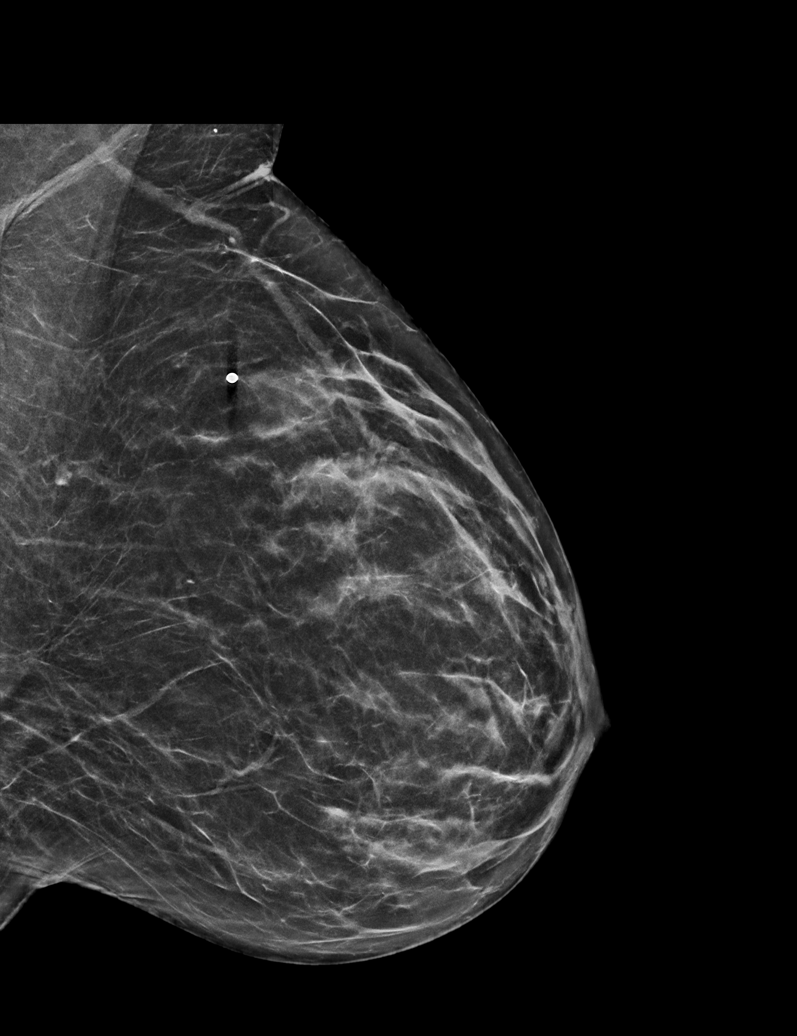

[R MLO synth-2D]
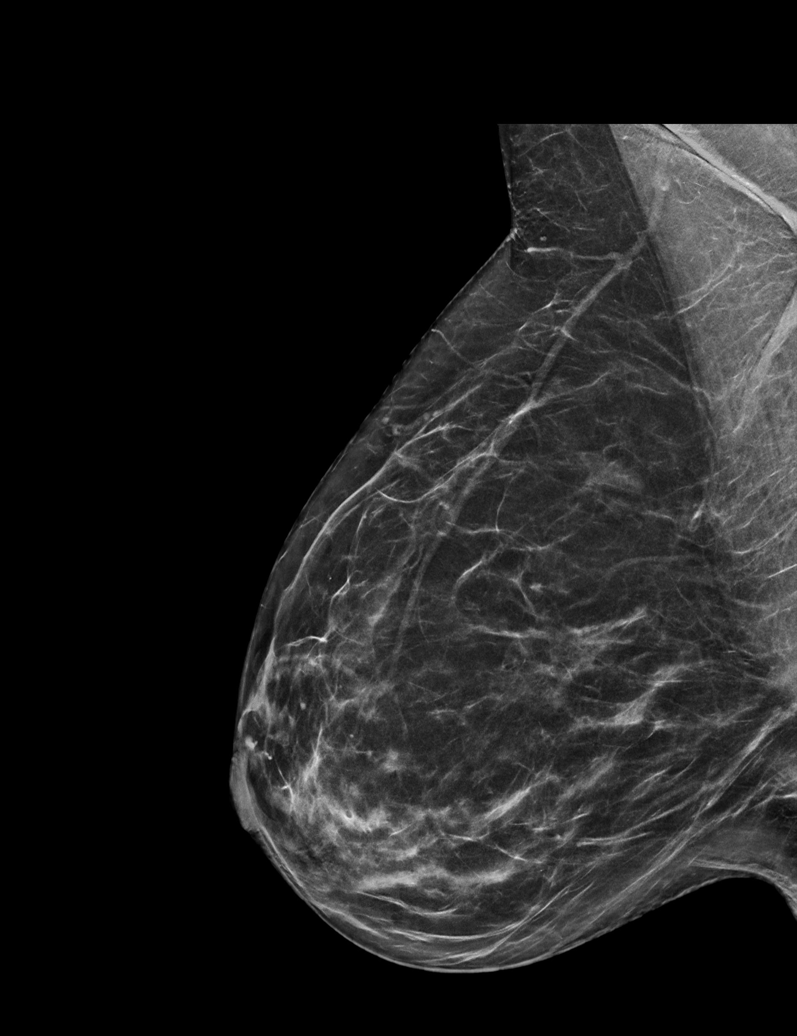

[L CC synth-2D]
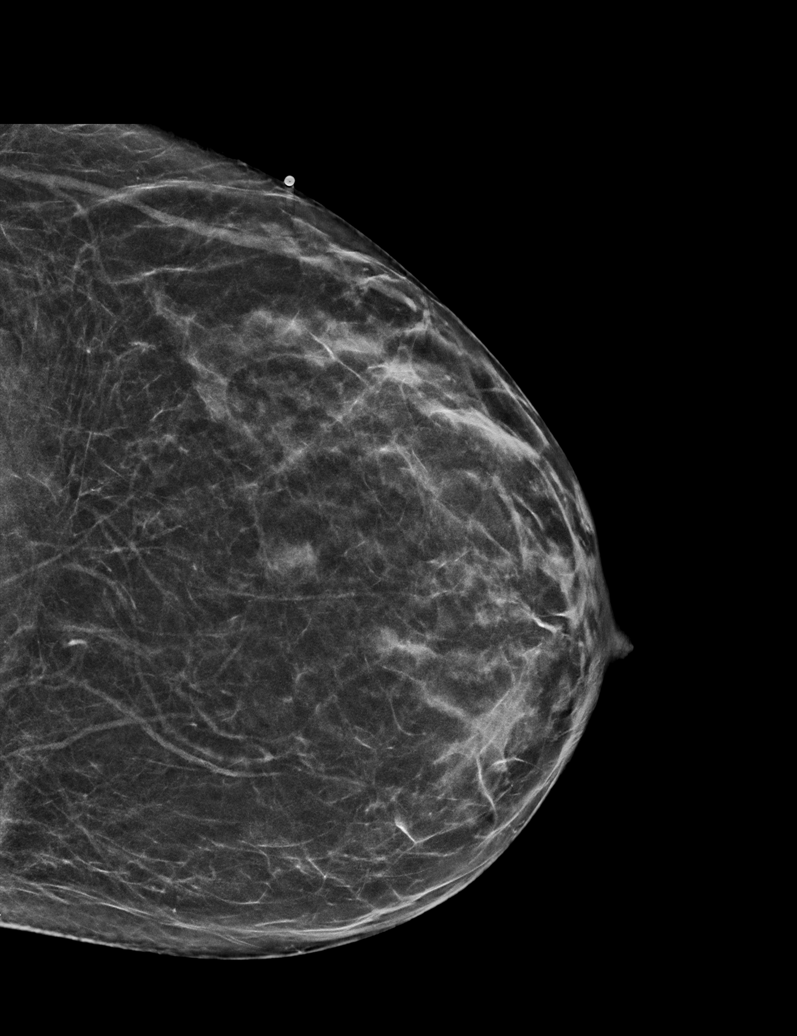

[L TAN synth-2D]
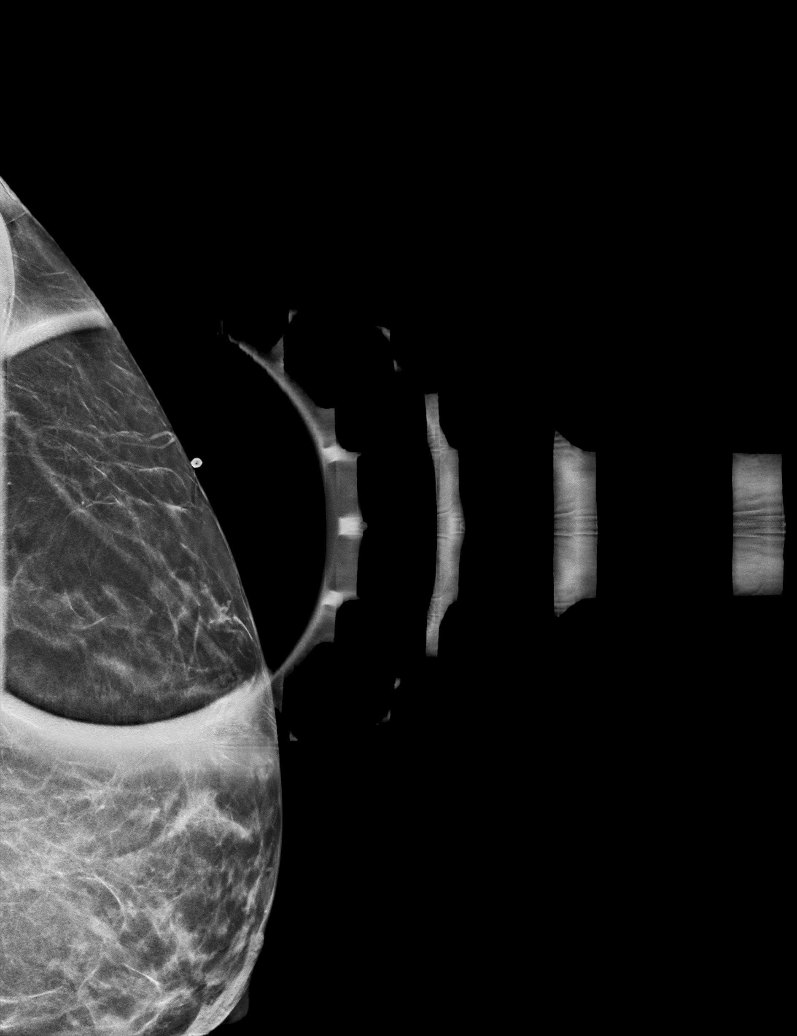

[R CC synth-2D]
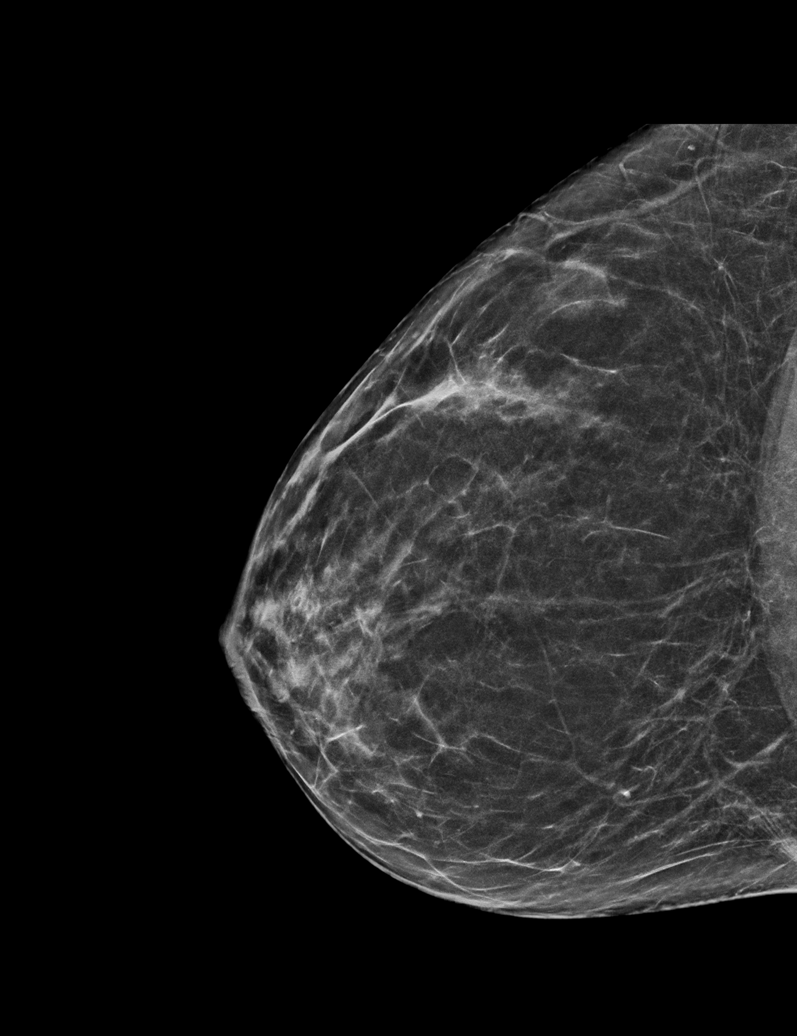

[L TAN tomo · tomo slice 25/48.0]
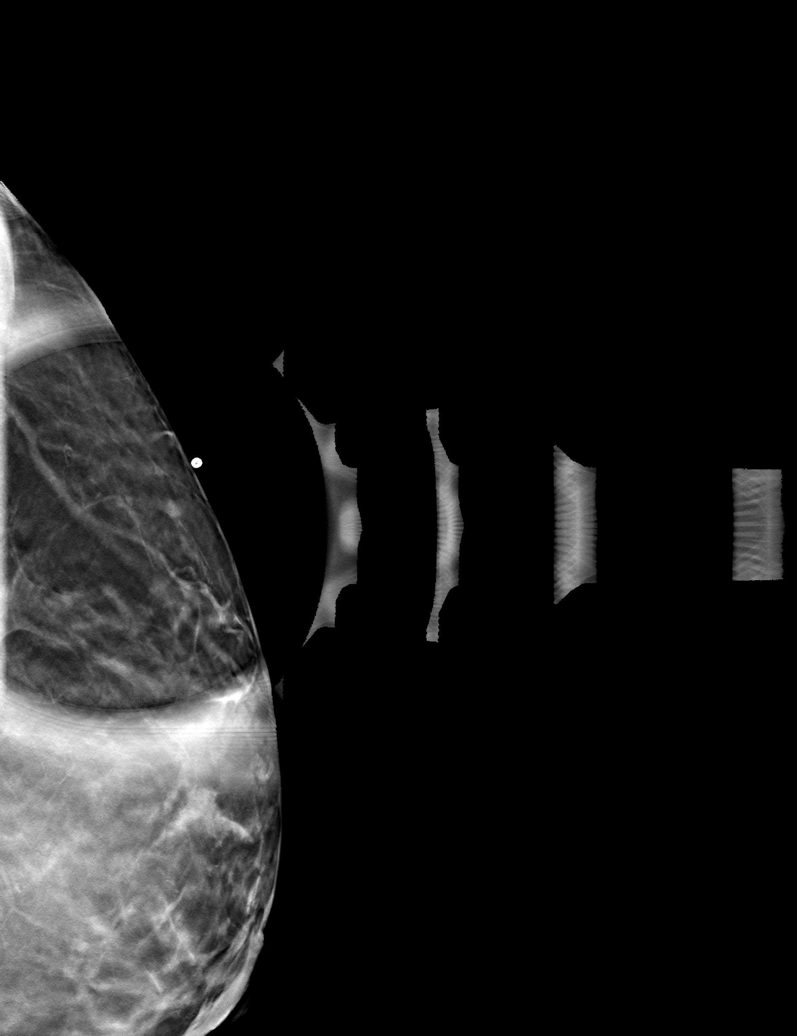

[6 of 30 positions shown; findings below may reference images not displayed]

ACR Breast Density Category b: There are scattered areas of
fibroglandular density.
FINDINGS: There are no masses, areas of significant asymmetry or areas of
architectural distortion suspicious calcifications. No mammographic
change

On physical exam, there is a soft mobile superficial nodule in the
breast quadrant.

Targeted left breast ultrasound is performed, showing normal tissue
in the upper quadrant. No mass is seen to correspond to palpable
abnormality. The palpable abnormality is consistent with a lobule of
breast tissue/fat.
IMPRESSION: No evidence of breast malignancy.

RECOMMENDATION:
Screening mammogram in one year.(Code:[id number])

I have discussed the findings and recommendations with the patient.
If applicable, a reminder letter will be sent to the patient
regarding the next appointment.

BI-RADS CATEGORY  1: Negative.

## 2023-01-22 ENCOUNTER — Other Ambulatory Visit: Payer: Self-pay | Admitting: Medical-Surgical

## 2023-01-22 DIAGNOSIS — F419 Anxiety disorder, unspecified: Secondary | ICD-10-CM

## 2023-01-22 MED ORDER — BUSPIRONE HCL 5 MG PO TABS: 5.0000 mg | ORAL_TABLET | Freq: Two times a day (BID) | ORAL | 2 refills | Status: DC

## 2023-03-05 ENCOUNTER — Other Ambulatory Visit: Payer: Self-pay | Admitting: Medical-Surgical

## 2023-03-05 DIAGNOSIS — F419 Anxiety disorder, unspecified: Secondary | ICD-10-CM

## 2023-04-10 ENCOUNTER — Other Ambulatory Visit: Payer: Self-pay | Admitting: Medical-Surgical

## 2023-04-23 ENCOUNTER — Ambulatory Visit: Payer: BC Managed Care – PPO | Admitting: Medical-Surgical

## 2023-04-30 ENCOUNTER — Ambulatory Visit: Payer: BC Managed Care – PPO | Admitting: Medical-Surgical

## 2023-04-30 ENCOUNTER — Encounter: Payer: Self-pay | Admitting: Medical-Surgical

## 2023-04-30 VITALS — BP 112/80 | HR 70 | Temp 98.8°F | Ht 60.0 in | Wt 146.1 lb

## 2023-04-30 DIAGNOSIS — G8929 Other chronic pain: Secondary | ICD-10-CM

## 2023-04-30 DIAGNOSIS — F419 Anxiety disorder, unspecified: Secondary | ICD-10-CM

## 2023-04-30 DIAGNOSIS — R519 Headache, unspecified: Secondary | ICD-10-CM | POA: Diagnosis not present

## 2023-04-30 DIAGNOSIS — Z23 Encounter for immunization: Secondary | ICD-10-CM | POA: Diagnosis not present

## 2023-04-30 MED ORDER — BUPROPION HCL ER (SR) 150 MG PO TB12: ORAL_TABLET | ORAL | 3 refills | Status: DC

## 2023-04-30 MED ORDER — BUSPIRONE HCL 5 MG PO TABS
5.0000 mg | ORAL_TABLET | Freq: Two times a day (BID) | ORAL | 3 refills | Status: DC
Start: 2023-04-30 — End: 2023-10-18

## 2023-04-30 MED ORDER — TOPIRAMATE 25 MG PO TABS: 25.0000 mg | ORAL_TABLET | Freq: Two times a day (BID) | ORAL | 3 refills | Status: DC

## 2023-04-30 MED ORDER — BUPROPION HCL ER (XL) 300 MG PO TB24: 300.0000 mg | ORAL_TABLET | Freq: Every day | ORAL | 3 refills | Status: DC

## 2023-04-30 NOTE — Progress Notes (Signed)
        Established patient visit  History, exam, impression, and plan:  1. Anxiety Pleasant 47 year old female with a history of anxiety currently treated with Wellbutrin 450 mg daily and BuSpar 5 mg twice daily.  Tolerating both medications well without side effects.  Feels that the medications are doing well to help control her mood and has no complaints.  Not currently doing counseling and does not feel like she needs any at this time.  Denies SI/HI.  Normal mood, affect, thought pattern, and cognition.  Her ultimate goal is to be able to cut down on medication use at some point and she will let me know if she wants to start counseling at that time.  For now continue Wellbutrin and BuSpar as prescribed. - busPIRone (BUSPAR) 5 MG tablet; Take 1-3 tablets (5-15 mg total) by mouth 2 (two) times daily.  Dispense: 90 tablet; Refill: 3  2. Chronic nonintractable headache, unspecified headache type History of chronic headaches that is currently treated with Topamax 25 mg twice daily.  Tolerating medication well without side effects.  Feels that her headaches are very well-controlled and she milligram has them on a regular basis.  As this is working well for her, she would like to continue.  Refilling Topamax today. - topiramate (TOPAMAX) 25 MG tablet; Take 1 tablet (25 mg total) by mouth 2 (two) times daily.  Dispense: 180 tablet; Refill: 3  Procedures performed this visit: None.  Return in about 6 months (around 10/31/2023) for annual physical exam or sooner if needed.  __________________________________ Thayer Ohm, DNP, APRN, FNP-BC Primary Care and Sports Medicine [facility] [place]

## 2023-06-11 ENCOUNTER — Telehealth: Payer: Self-pay | Admitting: Medical-Surgical

## 2023-06-11 NOTE — Telephone Encounter (Signed)
Patient called in regards to [facility] bill being coded in correctly date of service 10-26-22 please advise Insurance denied her claim  Please call [facility] [phone]

## 2023-06-19 ENCOUNTER — Encounter: Payer: Self-pay | Admitting: Medical-Surgical

## 2023-06-25 ENCOUNTER — Other Ambulatory Visit: Payer: Self-pay | Admitting: Obstetrics & Gynecology

## 2023-06-25 DIAGNOSIS — Z1231 Encounter for screening mammogram for malignant neoplasm of breast: Secondary | ICD-10-CM

## 2023-07-06 ENCOUNTER — Other Ambulatory Visit: Payer: Self-pay | Admitting: Medical-Surgical

## 2023-07-06 DIAGNOSIS — F419 Anxiety disorder, unspecified: Secondary | ICD-10-CM

## 2023-07-30 ENCOUNTER — Ambulatory Visit
Admission: RE | Admit: 2023-07-30 | Discharge: 2023-07-30 | Disposition: A | Discharge status: Home / Self Care | Payer: BC Managed Care – PPO | Source: Ambulatory Visit | Attending: Obstetrics & Gynecology | Admitting: Obstetrics & Gynecology

## 2023-07-30 DIAGNOSIS — Z1231 Encounter for screening mammogram for malignant neoplasm of breast: Secondary | ICD-10-CM

## 2023-10-18 ENCOUNTER — Encounter: Payer: Self-pay | Admitting: Medical-Surgical

## 2023-10-18 DIAGNOSIS — F419 Anxiety disorder, unspecified: Secondary | ICD-10-CM

## 2023-10-18 MED ORDER — BUPROPION HCL ER (SR) 150 MG PO TB12: ORAL_TABLET | ORAL | 3 refills | Status: AC

## 2023-10-18 MED ORDER — BUSPIRONE HCL 5 MG PO TABS
5.0000 mg | ORAL_TABLET | Freq: Two times a day (BID) | ORAL | 3 refills | Status: AC
Start: 2023-10-18 — End: ?

## 2023-10-18 MED ORDER — BUPROPION HCL ER (XL) 300 MG PO TB24: 300.0000 mg | ORAL_TABLET | Freq: Every day | ORAL | 3 refills | Status: AC

## 2023-10-29 ENCOUNTER — Encounter: Payer: Self-pay | Admitting: Medical-Surgical

## 2023-10-29 ENCOUNTER — Ambulatory Visit (INDEPENDENT_AMBULATORY_CARE_PROVIDER_SITE_OTHER): Payer: BC Managed Care – PPO | Admitting: Medical-Surgical

## 2023-10-29 VITALS — BP 105/68 | HR 69 | Resp 20 | Ht 60.0 in | Wt 142.2 lb

## 2023-10-29 DIAGNOSIS — R519 Headache, unspecified: Secondary | ICD-10-CM

## 2023-10-29 DIAGNOSIS — G8929 Other chronic pain: Secondary | ICD-10-CM | POA: Diagnosis not present

## 2023-10-29 DIAGNOSIS — F419 Anxiety disorder, unspecified: Secondary | ICD-10-CM

## 2023-10-29 DIAGNOSIS — Z0000 Encounter for general adult medical examination without abnormal findings: Secondary | ICD-10-CM

## 2023-10-29 NOTE — Patient Instructions (Signed)
Preventive Care 40-47 Years Old, Female Preventive care refers to lifestyle choices and visits with your health care provider that can promote health and wellness. Preventive care visits are also called wellness exams. What can I expect for my preventive care visit? Counseling Your health care provider may ask you questions about your: Medical history, including: Past medical problems. Family medical history. Pregnancy history. Current health, including: Menstrual cycle. Method of birth control. Emotional well-being. Home life and relationship well-being. Sexual activity and sexual health. Lifestyle, including: Alcohol, nicotine or tobacco, and drug use. Access to firearms. Diet, exercise, and sleep habits. Work and work environment. Sunscreen use. Safety issues such as seatbelt and bike helmet use. Physical exam Your health care provider will check your: Height and weight. These may be used to calculate your BMI (body mass index). BMI is a measurement that tells if you are at a healthy weight. Waist circumference. This measures the distance around your waistline. This measurement also tells if you are at a healthy weight and may help predict your risk of certain diseases, such as type 2 diabetes and high blood pressure. Heart rate and blood pressure. Body temperature. Skin for abnormal spots. What immunizations do I need?  Vaccines are usually given at various ages, according to a schedule. Your health care provider will recommend vaccines for you based on your age, medical history, and lifestyle or other factors, such as travel or where you work. What tests do I need? Screening Your health care provider may recommend screening tests for certain conditions. This may include: Lipid and cholesterol levels. Diabetes screening. This is done by checking your blood sugar (glucose) after you have not eaten for a while (fasting). Pelvic exam and Pap test. Hepatitis B test. Hepatitis C  test. HIV (human immunodeficiency virus) test. STI (sexually transmitted infection) testing, if you are at risk. Lung cancer screening. Colorectal cancer screening. Mammogram. Talk with your health care provider about when you should start having regular mammograms. This may depend on whether you have a family history of breast cancer. BRCA-related cancer screening. This may be done if you have a family history of breast, ovarian, tubal, or peritoneal cancers. Bone density scan. This is done to screen for osteoporosis. Talk with your health care provider about your test results, treatment options, and if necessary, the need for more tests. Follow these instructions at home: Eating and drinking  Eat a diet that includes fresh fruits and vegetables, whole grains, lean protein, and low-fat dairy products. Take vitamin and mineral supplements as recommended by your health care provider. Do not drink alcohol if: Your health care provider tells you not to drink. You are pregnant, may be pregnant, or are planning to become pregnant. If you drink alcohol: Limit how much you have to 0-1 drink a day. Know how much alcohol is in your drink. In the [place] one drink equals one 12 oz bottle of beer (355 mL), one 5 oz glass of wine (148 mL), or one 1 oz glass of hard liquor (44 mL). Lifestyle Brush your teeth every morning and night with fluoride toothpaste. Floss one time each day. Exercise for at least 30 minutes 5 or more days each week. Do not use any products that contain nicotine or tobacco. These products include cigarettes, chewing tobacco, and vaping devices, such as e-cigarettes. If you need help quitting, ask your health care provider. Do not use drugs. If you are sexually active, practice safe sex. Use a condom or other form of protection to   prevent STIs. If you do not wish to become pregnant, use a form of birth control. If you plan to become pregnant, see your health care provider for a  prepregnancy visit. Take aspirin only as told by your health care provider. Make sure that you understand how much to take and what form to take. Work with your health care provider to find out whether it is safe and beneficial for you to take aspirin daily. Find healthy ways to manage stress, such as: Meditation, yoga, or listening to music. Journaling. Talking to a trusted person. Spending time with friends and family. Minimize exposure to UV radiation to reduce your risk of skin cancer. Safety Always wear your seat belt while driving or riding in a vehicle. Do not drive: If you have been drinking alcohol. Do not ride with someone who has been drinking. When you are tired or distracted. While texting. If you have been using any mind-altering substances or drugs. Wear a helmet and other protective equipment during sports activities. If you have firearms in your house, make sure you follow all gun safety procedures. Seek help if you have been physically or sexually abused. What's next? Visit your health care provider once a year for an annual wellness visit. Ask your health care provider how often you should have your eyes and teeth checked. Stay up to date on all vaccines. This information is not intended to replace advice given to you by your health care provider. Make sure you discuss any questions you have with your health care provider. Document Revised: 05/31/2021 Document Reviewed: 05/31/2021 Elsevier Patient Education  2024 Elsevier Inc.  

## 2023-10-29 NOTE — Progress Notes (Signed)
Complete physical exam  Patient: Carolyn Rodriguez   DOB: [birth year]   47 y.o. Female  MRN: [id number]  Subjective:    Chief Complaint  Patient presents with   Annual Exam   Carolyn Rodriguez is a 47 y.o. female who presents today for a complete physical exam. She reports consuming a general diet.  Just restarted exercise.  She generally feels well. She reports sleeping well. She does not have additional problems to discuss today.    Most recent fall risk assessment:    04/30/2023    3:56 PM  Fall Risk   Falls in the past year? 0  Number falls in past yr: 0  Injury with Fall? 0  Risk for fall due to : No Fall Risks  Follow up Falls evaluation completed     Most recent depression screenings:    04/30/2023    3:57 PM 10/23/2022    2:45 PM  PHQ 2/9 Scores  PHQ - 2 Score 0 0    Vision:Within last year, Dental: No current dental problems and Receives regular dental care, and STD: The patient denies history of sexually transmitted disease.    Patient Care Team: Christen Butter, NP as PCP - General (Nurse Practitioner)   Outpatient Medications Prior to Visit  Medication Sig   buPROPion (WELLBUTRIN SR) 150 MG 12 hr tablet TAKE ONE TABLET BY MOUTH DAILY (TAKE ALONG WITH 300MG  TABLET FOR A TOTAL DAILY DOSE OF 450MG )   buPROPion (WELLBUTRIN XL) 300 MG 24 hr tablet Take 1 tablet (300 mg total) by mouth daily.   busPIRone (BUSPAR) 5 MG tablet Take 1-3 tablets (5-15 mg total) by mouth 2 (two) times daily.   Calcium Carbonate-Vit D-Min (CALCIUM 1200 PO) Take 1,200 mg by mouth at bedtime.   levonorgestrel (MIRENA) 20 MCG/24HR IUD by Intrauterine route.   Multiple Vitamins-Minerals (WOMENS DAILY FORMULA PO) Take 1 tablet by mouth at bedtime.   topiramate (TOPAMAX) 25 MG tablet Take 1 tablet (25 mg total) by mouth 2 (two) times daily.   [DISCONTINUED] 0.9 %  sodium chloride infusion    No facility-administered medications prior to visit.    Review of Systems  Constitutional:   Negative for chills, fever, malaise/fatigue and weight loss.  HENT:  Negative for congestion, ear pain, hearing loss, sinus pain and sore throat.   Eyes:  Negative for blurred vision, photophobia and pain.  Respiratory:  Negative for cough, shortness of breath and wheezing.   Cardiovascular:  Negative for chest pain, palpitations and leg swelling.  Gastrointestinal:  Negative for abdominal pain, constipation, diarrhea, heartburn, nausea and vomiting.  Genitourinary:  Negative for dysuria, frequency and urgency.  Musculoskeletal:  Negative for falls and neck pain.  Skin:  Negative for itching and rash.  Neurological:  Negative for dizziness, weakness and headaches.  Endo/Heme/Allergies:  Negative for polydipsia. Does not bruise/bleed easily.  Psychiatric/Behavioral:  Negative for depression, substance abuse and suicidal ideas. The patient is not nervous/anxious and does not have insomnia.      Objective:     BP 105/68 (BP Location: Left Arm, Cuff Size: Normal)   Pulse 69   Resp 20   Ht 5' (1.524 m)   Wt 142 lb 3.2 oz (64.5 kg)   SpO2 96%   BMI 27.77 kg/m    Physical Exam Vitals reviewed.  Constitutional:      General: She is not in acute distress.    Appearance: Normal appearance. She is obese. She is not ill-appearing.  HENT:  Head: Normocephalic and atraumatic.     Right Ear: Tympanic membrane, ear canal and external ear normal. There is no impacted cerumen.     Left Ear: Tympanic membrane, ear canal and external ear normal. There is no impacted cerumen.     Nose: Nose normal. No congestion or rhinorrhea.     Mouth/Throat:     Mouth: Mucous membranes are moist.     Pharynx: No oropharyngeal exudate or posterior oropharyngeal erythema.  Eyes:     General: No scleral icterus.       Right eye: No discharge.        Left eye: No discharge.     Extraocular Movements: Extraocular movements intact.     Conjunctiva/sclera: Conjunctivae normal.     Pupils: Pupils are equal,  round, and reactive to light.  Neck:     Thyroid: No thyromegaly.     Vascular: No carotid bruit or JVD.     Trachea: Trachea normal.  Cardiovascular:     Rate and Rhythm: Normal rate and regular rhythm.     Pulses: Normal pulses.     Heart sounds: Normal heart sounds. No murmur heard.    No friction rub. No gallop.  Pulmonary:     Effort: Pulmonary effort is normal. No respiratory distress.     Breath sounds: Normal breath sounds. No wheezing.  Abdominal:     General: Bowel sounds are normal. There is no distension.     Palpations: Abdomen is soft.     Tenderness: There is no abdominal tenderness. There is no guarding.  Musculoskeletal:        General: Normal range of motion.     Cervical back: Normal range of motion and neck supple.  Lymphadenopathy:     Cervical: No cervical adenopathy.  Skin:    General: Skin is warm and dry.  Neurological:     Mental Status: She is alert and oriented to person, place, and time.     Cranial Nerves: No cranial nerve deficit.  Psychiatric:        Mood and Affect: Mood normal.        Behavior: Behavior normal.        Thought Content: Thought content normal.        Judgment: Judgment normal.      No results found for any visits on 10/29/23.     Assessment & Plan:    Routine Health Maintenance and Physical Exam  Immunization History  Administered Date(s) Administered   Influenza,inj,Quad PF,6+ Mos 08/11/2021   Influenza-Unspecified 10/21/2020, 08/29/2023   PFIZER(Purple Top)SARS-COV-2 Vaccination 07/20/2020, 08/12/2020, 04/17/2021   Tdap 04/30/2023    Health Maintenance  Topic Date Due   MAMMOGRAM  07/29/2024   Cervical Cancer Screening (HPV/Pap Cotest)  04/10/2026   Colonoscopy  07/04/2031   DTaP/Tdap/Td (2 - Td or Tdap) 04/29/2033   INFLUENZA VACCINE  Completed   Hepatitis C Screening  Completed   HIV Screening  Completed   HPV VACCINES  Aged Out   COVID-19 Vaccine  Discontinued    Discussed health benefits of physical  activity, and encouraged her to engage in regular exercise appropriate for her age and condition.  1. Annual physical exam Checking labs as below.  Up-to-date on preventative care.  Wellness information provided with AVS. - CBC with Differential/Platelet - CMP14+EGFR  2. Anxiety Has been taking Wellbutrin for 150 mg daily and using BuSpar 10 mg daily in the morning.  Feels that both medications are working well for her but  is at a point that she is interested in trying to cut back on some of the medications that she is taking.  Would like to continue her current doses for now and as time passes, plan to work on a slow taper of both BuSpar and Wellbutrin.  Discussed recommendation for taper and she will update me as her dosing changes.  3. Chronic nonintractable headache, unspecified headache type Stable on current regimen.  Continue Topamax 25 mg twice daily.  Return in about 1 year (around 10/28/2024) for annual physical exam or sooner if needed.   Christen Butter, NP

## 2023-10-30 LAB — CBC WITH DIFFERENTIAL/PLATELET
Basophils Absolute: 0.1 10*3/uL (ref 0.0–0.2)
Basos: 1 %
EOS (ABSOLUTE): 0.1 10*3/uL (ref 0.0–0.4)
Eos: 2 %
Hematocrit: 42.4 % (ref 34.0–46.6)
Hemoglobin: 13.5 g/dL (ref 11.1–15.9)
Immature Grans (Abs): 0 10*3/uL (ref 0.0–0.1)
Immature Granulocytes: 0 %
Lymphocytes Absolute: 1.8 10*3/uL (ref 0.7–3.1)
Lymphs: 33 %
MCH: 30.6 pg (ref 26.6–33.0)
MCHC: 31.8 g/dL (ref 31.5–35.7)
MCV: 96 fL (ref 79–97)
Monocytes Absolute: 0.4 10*3/uL (ref 0.1–0.9)
Monocytes: 7 %
Neutrophils Absolute: 3.1 10*3/uL (ref 1.4–7.0)
Neutrophils: 57 %
Platelets: 300 10*3/uL (ref 150–450)
RBC: 4.41 x10E6/uL (ref 3.77–5.28)
RDW: 11.4 % — ABNORMAL LOW (ref 11.7–15.4)
WBC: 5.5 10*3/uL (ref 3.4–10.8)

## 2023-10-30 LAB — CMP14+EGFR
ALT: 16 [IU]/L (ref 0–32)
AST: 16 [IU]/L (ref 0–40)
Albumin: 4.1 g/dL (ref 3.9–4.9)
Alkaline Phosphatase: 34 [IU]/L — ABNORMAL LOW (ref 44–121)
BUN/Creatinine Ratio: 15 (ref 9–23)
BUN: 11 mg/dL (ref 6–24)
Bilirubin Total: <0.2 mg/dL (ref 0.0–1.2)
CO2: 23 mmol/L (ref 20–29)
Calcium: 9 mg/dL (ref 8.7–10.2)
Chloride: 105 mmol/L (ref 96–106)
Creatinine, Ser: 0.75 mg/dL (ref 0.57–1.00)
Globulin, Total: 2.1 g/dL (ref 1.5–4.5)
Glucose: 81 mg/dL (ref 70–99)
Potassium: 4.3 mmol/L (ref 3.5–5.2)
Sodium: 140 mmol/L (ref 134–144)
Total Protein: 6.2 g/dL (ref 6.0–8.5)
eGFR: 99 mL/min/{1.73_m2} (ref >59)

## 2024-03-07 ENCOUNTER — Other Ambulatory Visit: Payer: Self-pay | Admitting: Medical-Surgical

## 2024-03-07 DIAGNOSIS — R519 Headache, unspecified: Secondary | ICD-10-CM

## 2024-03-13 ENCOUNTER — Encounter: Payer: Self-pay | Admitting: Medical-Surgical

## 2024-05-28 ENCOUNTER — Encounter: Payer: Self-pay | Admitting: Medical-Surgical

## 2024-07-02 ENCOUNTER — Other Ambulatory Visit: Payer: Self-pay | Admitting: Obstetrics & Gynecology

## 2024-07-02 DIAGNOSIS — Z1231 Encounter for screening mammogram for malignant neoplasm of breast: Secondary | ICD-10-CM

## 2024-08-13 ENCOUNTER — Ambulatory Visit
Admission: RE | Admit: 2024-08-13 | Discharge: 2024-08-13 | Disposition: A | Discharge status: Home / Self Care | Payer: Self-pay | Source: Ambulatory Visit | Attending: Obstetrics & Gynecology | Admitting: Obstetrics & Gynecology

## 2024-08-13 DIAGNOSIS — Z1231 Encounter for screening mammogram for malignant neoplasm of breast: Secondary | ICD-10-CM

## 2024-08-26 ENCOUNTER — Ambulatory Visit: Payer: Self-pay | Admitting: Obstetrics & Gynecology

## 2024-09-13 ENCOUNTER — Encounter: Payer: Self-pay | Admitting: Medical-Surgical

## 2024-09-13 DIAGNOSIS — R519 Headache, unspecified: Secondary | ICD-10-CM

## 2024-09-14 ENCOUNTER — Other Ambulatory Visit: Payer: Self-pay | Admitting: Medical-Surgical

## 2024-09-14 DIAGNOSIS — R519 Headache, unspecified: Secondary | ICD-10-CM

## 2024-09-14 MED ORDER — TOPIRAMATE 25 MG PO TABS: 25.0000 mg | ORAL_TABLET | Freq: Two times a day (BID) | ORAL | 0 refills | Status: DC

## 2024-09-14 NOTE — Telephone Encounter (Signed)
 Requesting rx rf of Topiramate  25mg  Last written 04/30/2023 Last OV 10/29/2023 Upcoming appt 10/29/2024

## 2024-09-14 NOTE — Telephone Encounter (Unsigned)
 Copied from CRM #[id number]. Topic: Clinical - Medication Refill >> Sep 14, 2024  4:23 PM Dedra B wrote: Medication: topiramate  (TOPAMAX ) 25 MG tablet  Has the patient contacted their pharmacy? Yes, said there were no refills  This is the patient's preferred pharmacy:  [id number] - [place], [place] - [address] AT [facility] & [address] [place] [address] Phone: [phone] Fax: [phone]  Is this the correct pharmacy for this prescription? Yes  Has the prescription been filled recently? No  Is the patient out of the medication? Yes  Has the patient been seen for an appointment in the last year OR does the patient have an upcoming appointment? Yes  Can we respond through MyChart? Yes  Agent: Please be advised that Rx refills may take up to 3 business days. We ask that you follow-up with your pharmacy.

## 2024-10-29 ENCOUNTER — Encounter: Payer: BC Managed Care – PPO | Admitting: Medical-Surgical

## 2024-12-13 ENCOUNTER — Other Ambulatory Visit: Payer: Self-pay | Admitting: Medical-Surgical

## 2024-12-13 DIAGNOSIS — G8929 Other chronic pain: Secondary | ICD-10-CM
